# Patient Record
Sex: Male | Born: 1950 | Race: White | Hispanic: No | Marital: Married | Smoking: Former smoker
Health system: Southern US, Community
[De-identification: ages and names within clinical notes are randomized; demographics above are authoritative.]

## PROBLEM LIST (undated history)

## (undated) DIAGNOSIS — I209 Angina pectoris, unspecified: Secondary | ICD-10-CM

## (undated) DIAGNOSIS — F1911 Other psychoactive substance abuse, in remission: Secondary | ICD-10-CM

## (undated) DIAGNOSIS — N189 Chronic kidney disease, unspecified: Secondary | ICD-10-CM

## (undated) DIAGNOSIS — R519 Headache, unspecified: Secondary | ICD-10-CM

## (undated) DIAGNOSIS — Z87898 Personal history of other specified conditions: Secondary | ICD-10-CM

## (undated) DIAGNOSIS — R51 Headache: Secondary | ICD-10-CM

## (undated) DIAGNOSIS — I219 Acute myocardial infarction, unspecified: Secondary | ICD-10-CM

## (undated) DIAGNOSIS — M109 Gout, unspecified: Secondary | ICD-10-CM

## (undated) DIAGNOSIS — Z944 Liver transplant status: Secondary | ICD-10-CM

## (undated) DIAGNOSIS — F4310 Post-traumatic stress disorder, unspecified: Secondary | ICD-10-CM

## (undated) DIAGNOSIS — B1920 Unspecified viral hepatitis C without hepatic coma: Secondary | ICD-10-CM

## (undated) DIAGNOSIS — I2510 Atherosclerotic heart disease of native coronary artery without angina pectoris: Secondary | ICD-10-CM

## (undated) DIAGNOSIS — Z8639 Personal history of other endocrine, nutritional and metabolic disease: Secondary | ICD-10-CM

## (undated) DIAGNOSIS — D649 Anemia, unspecified: Secondary | ICD-10-CM

## (undated) HISTORY — DX: Personal history of other specified conditions: Z87.898

## (undated) HISTORY — DX: Atherosclerotic heart disease of native coronary artery without angina pectoris: I25.10

## (undated) HISTORY — DX: Other psychoactive substance abuse, in remission: F19.11

## (undated) HISTORY — DX: Personal history of other endocrine, nutritional and metabolic disease: Z86.39

## (undated) HISTORY — DX: Headache, unspecified: R51.9

## (undated) HISTORY — DX: Headache: R51

## (undated) HISTORY — DX: Unspecified viral hepatitis C without hepatic coma: B19.20

## (undated) HISTORY — DX: Gout, unspecified: M10.9

## (undated) HISTORY — PX: OTHER SURGICAL HISTORY: SHX169

## (undated) HISTORY — DX: Chronic kidney disease, unspecified: N18.9

## (undated) HISTORY — DX: Anemia, unspecified: D64.9

---

## 1987-09-10 HISTORY — PX: JOINT REPLACEMENT: SHX530

## 1999-09-27 ENCOUNTER — Encounter: Admission: RE | Admit: 1999-09-27 | Discharge: 1999-09-27 | Payer: Self-pay | Admitting: Orthopaedic Surgery

## 1999-09-27 ENCOUNTER — Encounter: Payer: Self-pay | Admitting: Orthopaedic Surgery

## 1999-10-30 ENCOUNTER — Encounter: Payer: Self-pay | Admitting: Orthopaedic Surgery

## 1999-11-01 ENCOUNTER — Encounter: Payer: Self-pay | Admitting: Orthopaedic Surgery

## 1999-11-01 ENCOUNTER — Inpatient Hospital Stay (HOSPITAL_COMMUNITY): Admission: RE | Admit: 1999-11-01 | Discharge: 1999-11-05 | Payer: Self-pay | Admitting: Orthopaedic Surgery

## 1999-11-03 ENCOUNTER — Encounter: Payer: Self-pay | Admitting: Orthopaedic Surgery

## 2002-09-06 ENCOUNTER — Encounter: Payer: Self-pay | Admitting: Family Medicine

## 2002-09-06 ENCOUNTER — Ambulatory Visit (HOSPITAL_COMMUNITY): Admission: RE | Admit: 2002-09-06 | Discharge: 2002-09-06 | Payer: Self-pay | Admitting: Family Medicine

## 2005-12-29 ENCOUNTER — Ambulatory Visit: Payer: Self-pay | Admitting: Emergency Medicine

## 2005-12-29 ENCOUNTER — Inpatient Hospital Stay (HOSPITAL_COMMUNITY): Admission: EM | Admit: 2005-12-29 | Discharge: 2006-01-04 | Payer: Self-pay | Admitting: Emergency Medicine

## 2007-08-18 ENCOUNTER — Ambulatory Visit: Payer: Self-pay | Admitting: Internal Medicine

## 2007-09-10 DIAGNOSIS — I219 Acute myocardial infarction, unspecified: Secondary | ICD-10-CM

## 2007-09-10 HISTORY — DX: Acute myocardial infarction, unspecified: I21.9

## 2007-11-30 ENCOUNTER — Ambulatory Visit: Payer: Self-pay | Admitting: Cardiology

## 2007-11-30 ENCOUNTER — Encounter: Payer: Self-pay | Admitting: Cardiology

## 2007-11-30 ENCOUNTER — Inpatient Hospital Stay (HOSPITAL_COMMUNITY): Admission: EM | Admit: 2007-11-30 | Discharge: 2007-12-03 | Payer: Self-pay | Admitting: Cardiology

## 2007-12-21 ENCOUNTER — Ambulatory Visit: Payer: Self-pay | Admitting: Cardiology

## 2007-12-25 ENCOUNTER — Ambulatory Visit: Payer: Self-pay | Admitting: Cardiology

## 2008-01-20 ENCOUNTER — Ambulatory Visit: Payer: Self-pay | Admitting: Cardiology

## 2008-02-16 ENCOUNTER — Ambulatory Visit: Payer: Self-pay | Admitting: Cardiology

## 2008-06-17 ENCOUNTER — Ambulatory Visit: Payer: Self-pay | Admitting: Cardiology

## 2011-01-22 NOTE — Consult Note (Signed)
NAME:  Valenti, Emmet                 ACCOUNT NO.:  [id number]   MEDICAL RECORD NO.:  [id number]           PATIENT TYPE:  AMB   LOCATION:  DAY                           FACILITY:  [facility]   PHYSICIAN:  R. Roetta Sessions, M.D. DATE OF BIRTH:  [birth year]   DATE OF CONSULTATION:  DATE OF DISCHARGE:                                 CONSULTATION   REQUESTING PHYSICIAN:  Dr. Janene Madeira, [place].   REASON FOR CONSULTATION:  Rectal bleeding   HISTORY OF PRESENT ILLNESS:  Mr. Vandeusen is a 60 year old Caucasian male.  He has had a two-week history of passing moderate amounts of bright red  blood with clots in his stool.  He tells me at times he does not even  have to have a bowel movement.  He is just passing clots.  He has been  having a bowel movement about every day or every other day.  He denies  any significant straining, denies any proctalgia or pruritus.  He has  had intermittent bilateral lower quadrant abdominal cramping and a  stabbing-type pain that is usually after a few minutes.  It is not every  day.  He denies any fever or chills, denies any history of diarrhea or  constipation.  He denies any nausea or vomiting.  His weight is down  about 10 pounds in the last 3 months.  He has noted some anorexia.  He  has rare indigestion, which he does not take medicine for, denies any  dysphagia or odynophagia.  His last colonoscopy was about 16 years ago  at [facility], per his report, prior to his liver transplant.   PAST MEDICAL/SURGICAL HISTORY:  He has history of  C. diff, which was  resolved with Flagyl back in 2004, when he was seen by Dr. Jena Gauss.  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  He has history of stage III renal failure followed by [facility].  He  had a rectal cyst removed by Dr. Larina Bras in 1972 which was benign.  He had  a left hip replacement and revision.  He has a plate in his right femur.  He has history of gout.  His last colonoscopy was  reportedly done prior  to his OLT about 16 years ago, and he believes it was benign.  He has a  history of depression.  He is status post cholecystectomy and left knee  surgery.   CURRENT MEDICATIONS:  1. Cyclosporin 100 mg daily.  2. Prozac 20 mg daily.  3. Amlodipine 10 mg daily.  4. Benazepril 40 mg daily.  5. Allopurinol 100 mg b.i.d.  6. Sodium polystyrene sulfonate, 8 ounces weekly.  7. Calcitriol 0.5 mg every other day.   ALLERGIES:  CODEINE CAUSES HIVES.   FAMILY HISTORY:  There is no known family history of colorectal  carcinoma, liver or chronic GI problems.  Mother deceased age 50,  secondary to a brain tumor.  Father decreased at age 62 secondary to  coronary artery disease and MI.  He has a lost multiple  siblings with  history significant for lung cancer, COPD and asthma.   SOCIAL HISTORY:  Mr. Lisenbee is married.  He has two healthy children.  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  He denies any  tobacco, alcohol or drug use.   REVIEW OF SYSTEMS:  See HPI, otherwise negative.   PHYSICAL EXAMINATION:  VITAL SIGNS:  Weight 161 pounds, height 71  inches, temp 98 degrees, blood pressure 162/90, pulse 60.  GENERAL:  He is a well-developed, well-nourished Caucasian male in no  acute distress.  HEENT:  Sclerae clear, nonicteric, conjunctivae pink, oropharynx pink  and moist without any lesions.  NECK:  Supple without any mass or thyromegaly.  CHEST:  Heart regular rate and rhythm, normal S1, S2 without any  murmurs, clicks, rubs or gallops.  LUNGS:  Clear to auscultation bilaterally.  ABDOMEN:  Positive bowel sounds x4.  No bruits auscultated.  Soft,  nontender, nondistended without palpable mass or hepatosplenomegaly, no  rebound tenderness or guarding.  EXTREMITIES:  Without clubbing or edema bilaterally.   IMPRESSION:  Mr. Beckom is a 59 year old Caucasian male with a two-week  history of intermittent rectal bleeding with clots.  I suspect we  are  dealing with diverticular bleeding, given the fact he is having some  intermittent abdominal cramping as well.  Other possibilities include  benign anorectal bleeding, as well as colorectal carcinoma.  He is going  to require further evaluation with colonoscopy.  Upon further  discussion, he is wanting his procedures to be done in [place], [place].  Unfortunately, our physicians do not go to [place] to perform  procedures at this time.   PLAN:  1. I have urged him to have a colonoscopy performed, and he will      contact the gastroenterologist, Dr. Dionicia Abler in [place], [place].  2. Will request recent lab work, especially CBC to be reviewed through      our office.  3. He is to contact us, if he has any further questions or is in need      of followup.      Lorenza Burton, N.P.      Jonathon Bellows, M.D.  Electronically Signed    KJ/MEDQ  D:  08/18/2007  T:  08/18/2007  Job:  [id number]   cc:   Kirstie Peri, MD  Fax: [phone]

## 2011-01-22 NOTE — Assessment & Plan Note (Signed)
[facility]                          [facility] NOTE   NAME:[name], [name]                        MRN:          [id number]  DATE:12/21/2007                            DOB:          10/31/1950    HISTORY OF PRESENT ILLNESS:  The patient is a 60 year old male with  history of recent anterior wall myocardial infarction on November 30, 2006.  The patient also has a history of liver transplant with hepatitis-C  positive and cirrhosis. The patient is currently immunosuppressive  therapy, has chronic renal failure, CKD III with creatinine of 2.5, as  well as hypertension. The patient was successfully treated with a  Liberte bare metal stent by Dr. Juanda Chance. __________  he had single vessel  coronary artery disease. Prior to his myocardial infarction, the patient  also reported hematochezia and was in the progress of getting scheduled  for colonoscopy. This will now be rescheduled, and the patient  __________  stop Plavix 1 week before the procedure. Of note, the  patient did have an episode of substernal chest pain about a week ago  which happened suddenly; described as severe substernal, lasting very  briefly but he felt presyncopal with this. He took a nitroglycerin with  resolution of his symptoms. Since that episode, he has had no further  substernal chest pain. His exercise tolerance has been good.   MEDICATIONS:  1. Aspirin 325 mg p.o. q. day.  2. Plavix 75 mg p.o. q. day.  3. Pravachol 40 mg p.o. q. day.  4. __________  cyclosporin 100 mg p.o. q. day.  5. Prozac 20 mg p.o. q. day.  6. Allopurinol 100 mg p.o. q. day.  7. Coreg 25 mg p.o. b.i.d.  8. Iron.   PHYSICAL EXAMINATION:  VITAL SIGNS:  Blood pressure 160/86, heart rate  51. Weight 163 pounds.  NECK:  Normal carotid upstroke and no carotid bruits.  LUNGS:  Clear breath sounds bilaterally.  HEART:  Regular rate and rhythm. Normal S1, S2. No murmurs or rubs.  ABDOMEN:  Soft,  nontender. No rebound or guarding. Good bowel sounds.  EXTREMITIES:  No clubbing, cyanosis, or edema.  NEURO:  The patient is alert, oriented, and grossly nonfocal.   PROBLEM LIST:  1. Status post anterior wall myocardial infarction with Liberte stent      to the LAD.  2. Essentially single vessel coronary artery disease.  3. Status post liver transplant.  4. Hepatitis-C.  5. Hematochezia.   PLAN:  1. The patient will need to continue Plavix until December 31, 2007. At      that point he can stop Plavix and can be scheduled for a      colonoscopy 1 week later.  2. The patient's episode of substernal chest pain was somewhat      concerning for angina, but he has had no exertional symptoms. We      will schedule him for a Cardiolite study, particularly in light of      his significantly abnormal electrocardiogram with giant T-wave      inversion in the anterolateral leads.  3.  I have given the patient a prescription of Imdur 30 mg p.o. q.      daily.  4. The patient will follow up for review of his Cardiolite, likely to      proceed next month with a colonoscopy.     Learta Codding, MD,FACC  Electronically Signed    GED/MedQ  DD: 12/21/2007  DT: 12/21/2007  Job #: [id number]   cc:   Lionel December, M.D.  Kirstie Peri, MD

## 2011-01-22 NOTE — Assessment & Plan Note (Signed)
[facility]                          [facility] NOTE   NAME:Minier, KASTEN LEVEQUE                        MRN:          [id number]  DATE:06/17/2008                            DOB:          June 08, 1951    PRIMARY CARDIOLOGIST:  Learta Codding, MD, [facility]   REASON FOR VISIT:  Scheduled followup.   Mr. Bailey returns to our clinic since last seen here in early June.  He  presents with no signs/symptoms suggestive of interim development of  either unstable angina pectoris or decompensated heart failure.  In  fact, he is participating in a program here at [facility] where  he can exercise regularly, 3-5 times a week.   Mr. Honse was currently granted permanent disability, given his multiple  comorbidities including ischemic cardiomyopathy.  He is also in the  process of transferring all of this care to the [facility] in [place],  [place].  He reports having had recent blood work, just 1 week  ago.   Although Mr. Hyser has gained approximately 3 pounds, he denies any PND,  orthopnea, or significant lower extremity edema.  He denies any  exertional angina pectoris, significant dyspnea, or tachy palpitations.   The patient denies any recent falls, but does have occasional sensation  of lightheadedness upon abrupt standing.  He also reports experiencing a  significant headache after taking his scheduled Imdur dose.   CURRENT MEDICATIONS:  1. Aspirin 325 daily.  2. Pravachol 40 daily.  3. Cyclosporine 100 daily.  4. Prozac 20 daily.  5. Allopurinol 100 daily.  6. Iron supplement.  7. Imdur 30 daily.  8. Norvasc 5 daily.  9. Toprol-XL 12.5 daily.   PHYSICAL EXAMINATION:  VITAL SIGNS:  Blood pressure 136/83, pulse 55,  weight 166.6 (up 3).  GENERAL:  A 60 year old male, sitting upright, in no distress.  HEENT:  Normocephalic, atraumatic.  NECK:  Palpable carotid pulses without bruits; no JVD.  LUNGS:  Clear to auscultation in all  fields.  HEART:  Regular rate and rhythm.  No significant murmurs.  No S3.  ABDOMEN:  Soft, nontender.  EXTREMITIES:  No pedal edema.  NEUROLOGICAL:  Flat affect, but no focal deficit.   IMPRESSION:  1. Ischemic cardiomyopathy.      a.     Status post acute anterior myocardial infarction/bare-metal       stenting of proximal left anterior descending, March 2009.      b.     Residual nonobstructive coronary artery disease.      c.     Ejection fraction 40% by 2D echo.      d.     Nonischemic adenosine Cardiolite; ejection fraction 52%,       April 2009.  2. Chronic renal insufficiency.  3. Asymptomatic sinus bradycardia.  4. Status post liver transplantation in 1993.      a.     Secondary to hepatitis C.  5. Chronic normocytic anemia.  6. Dyslipidemia.  7. Headaches, secondary to Imdur.   PLAN:  1. A 2D echocardiogram for reassessment of left ventricular function,  to see if there has been any recovery since his myocardial      infarction.  2. Request most recent blood work from the [facility] in [place], for      continued close monitoring of electrolytes and renal function.  Of      note, however, Mr. Ihde does state that he is in the process of      transferring all of his care, including cardiac, to the [facility]      in [place].  3. Discontinue Imdur, secondary to persistent headache.  Up titrate      Norvasc to 10 mg daily for added blood pressure and antianginal      benefit.  4. Down titrate aspirin to 81 mg daily, to be continued indefinitely.  5. Schedule return clinic follow up with myself and Dr. Andee Lineman in 6      months, or sooner if needed.      Gene Serpe, PA-C  Electronically Signed      Learta Codding, MD,FACC  Electronically Signed   GS/MedQ  DD: 06/17/2008  DT: 06/18/2008  Job #: [id number]   cc:   Kirstie Peri, MD

## 2011-01-22 NOTE — Discharge Summary (Signed)
NAME:  [name], [name]                 ACCOUNT NO.:  [id number]   MEDICAL RECORD NO.:  [id number]          PATIENT TYPE:  INP   LOCATION:  2006                         FACILITY:  [facility]   PHYSICIAN:  Everardo Beals. Juanda Chance, MD, FACCDATE OF BIRTH:  1951/08/08   DATE OF ADMISSION:  11/30/2007  DATE OF DISCHARGE:  12/03/2007                               DISCHARGE SUMMARY   PRIMARY FINAL DISCHARGE DIAGNOSIS:  ST elevation myocardial infarction.   SECONDARY DIAGNOSES:  1. History of liver transplant.  2. History of hepatitis C.  3. Hypertension.  4. History of remote accident with a right distal femur fracture      treated with open reduction and internal fixation (ORIF).  5. History of transient ischemic attack.  6. History of gout.  7. Allergy or intolerance to codeine.  8. History of left total hip arthroplasty, failed, with revision in      2001.  9. Diagnosis of rectal bleeding in December 2008.  10.Chronic kidney disease stage 3 with GFR of 33, BUN 28, creatinine      2.10 at discharge this admission.  11.History of Clostridium difficile in 2004.  12.Status post cholecystectomy and knee surgery.  13.Family history of coronary artery disease in his father.  14.Dyslipidemia with total cholesterol of 138, triglycerides 86, HDL      18, and LDL 103 on this admission.  15.History of anemia originally secondary to acute blood loss      intraoperatively, but now has a hemoglobin of 9.5, hematocrit of      28.3 post cath.   TIME OF DISCHARGE:  44 minutes.   HOSPITAL COURSE:  [name] is a 60 year old male with no previous  history of coronary artery disease.  He went to [facility] on the  day of admission because of sudden onset of chest pain.  His EKG was  consistent with an acute anterior wall infarction.  Dr. Andee Lineman, called  [name] for transfer to [facility] for emergent  catheterization.   The cardiac catheterization showed a 95% mid LAD which was treated with  PTCA  and bare-metal stent reducing the stenosis to zero.  Non  obstructive disease at 50% in the circumflex as well as 50% in the first  posterolateral branch, 30% in the RCAs is to be treated medically.  Because of renal insufficiency, no LV gram was done.  An echocardiogram  showed an EF of 40% with mildly increased left ventricular wall  thickness and an akinetic septum and apex.  There was trivial mitral  valvular regurgitation and the aortic valve was mildly calcified.  Mr.  Harrington CK-MB peaked to 115/13.3 with troponin I of 1.94.  CBC post  procedure showed a hemoglobin of 9.5, with a hematocrit 28.3.  He was  11.5/34.2 at [facility] prior to admission.  He is to follow up with his  primary care physician and GI physician for this.  A lipid profile was  described above and he was initially put on 40 Zocor.  However, Mr.  Harrington stated that he has trouble affording his medications and requested  a $4 drug, therefore, the Zocor was changed to Pravachol.   [name] was seen by cardiac rehab and had a nutrition consult as well.  He had some hyperkalemia with an initial potassium level of 5.9.  This  decreased by discharge to 4.5.  He was given information on renal diet  and avoiding potassium-rich foods.  [name] indicates that he will be  compliant with this.  He wants to follow with cardiac rehab as an  outpatient.  Postprocedure his BUN and creatinine remains fairly stable  with a preprocedure BUN and creatinine of 42/2.5, which dropped to  21/2.0 postprocedure, and by discharge he was 28/2.1.  GFR did not  significantly change during the stay.   On December 03, 2007, [name] was ambulating without chest pain or  shortness of breath.  He had been on possibly both Lotrel and amlodipine  prior to admission, but these were both stopped.  He had a beta blocker  added to his medication regimen for because of his renal insufficiency  and recent dye use, Dr. Juanda Chance recommended avoiding an ACE  inhibitor for  now.  Dr. Juanda Chance evaluated [name] and felt that he could be safely  discharged home on December 03, 2007, with close outpatient followup.   DISCHARGE INSTRUCTIONS:  1. His activity level is to be increased gradually.  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  He      can return to work when cleared by MD.  He is encouraged to stick      to a low-sodium, heart-healthy, low-potassium diet.  He is to      follow up with Dr. Sherryll Burger, Dr. Eustaquio Maize, Dr. Dudley Major, Dr. Marcy Salvo, and Dr.      Kristian Covey, as needed.   DISCHARGE MEDICATIONS:  1. Aspirin 325 mg daily.  2. Plavix 75 mg daily.  3. Nitroglycerin sublingual p.r.n.  4. Pravachol 40 mg a day.  5. Cyclosporine 100 mg daily.  6. Prozac 20 mg a day.  7. He is to stop Lotrel and benazepril.  8. He is to hold amlodipine 10 mg daily.  9. Allopurinol 100 mg daily.  10.Carvedilol 25 mg 1 tab b.i.d.      [name], PA-C      Bruce R. Juanda Chance, MD, [facility]  Electronically Signed    RB/MEDQ  D:  12/03/2007  T:  12/04/2007  Job:  [id number]   cc:   Vladimir Faster, MD  Hilliard Clark. Eustaquio Maize, MD  Clearnce Hasten. Marcy Salvo, MD  Dr. Harold Barban, M.D.

## 2011-01-22 NOTE — Cardiovascular Report (Signed)
[name], [name]                 ACCOUNT NO.:  [id number]   MEDICAL RECORD NO.:  [id number]          PATIENT TYPE:  OIB   LOCATION:  2903                         FACILITY:  [facility]   PHYSICIAN:  Jose R. Juanda Chance, MD, FACCDATE OF BIRTH:  [birth year]   DATE OF PROCEDURE:  DATE OF DISCHARGE:                            CARDIAC CATHETERIZATION   PROCEDURE:  Cardiac catheterization and percutaneous coronary  intervention.   HISTORY:  [name] is 60 years old with no prior history of known heart  disease.  He does have history of a liver transplant in 1993 at [facility].  He  also has renal insufficiency with creatinine of 2.4.  He also has had  some recent bright red rectal bleeding which has not yet  been  evaluated.  Today at 8 o'clock he developed chest pain and came to  [facility] emergency room where his ECG showed an acute anterior wall  infarction.  He was seen by Dr. Andee Lineman and transferred to [place] by [facility]  for intervention.   PROCEDURE:  The procedure was performed via the right femoral arterial  sheath and 6-French preformed coronary catheters.  A front wall arterial  puncture was performed and Omnipaque contrast was used.  Because of the  elevated creatinine, we attempted to minimize contrast.  We only took  one picture of the right coronary artery.  Did not take picture of the  left ventricle.  At completion of the diagnostic study, we recognized  there was a tight lesion in the proximal LAD and preparation was made  for intervention.   The patient given antiemetics bolus infusion.  He had already been given  a Plavix load and chewable aspirin.  We used a Q-4 Guidant 6-French  guiding catheter with side holes.  We crossed the lesion in the proximal  LAD with a Prowater wire without difficulty.  We predilated with 2.5 x  50-mm Maverick, performing one inflation up to 8 atmospheres for 30  seconds.  We then deployed a 3 x 20 mm Liberte stent.  This stent  overlapped the first moderate  size diagonal branch.  I would deploy this  with one inflation of 14 atmospheres for 30 seconds.  We then  postdilated with a 3.5 x 15 mm Ambler Voyager performing two inflations to  14 atmospheres for 20 seconds.  The stent __________  the diagonal  branch, but did not compromise flow and the flow remained TIMI-3.   The patient tolerated the procedure well and left the laboratory in  satisfactory condition.  The right femoral was closed was Angio-Seal  anesthesia.   The aortic pressure was 165/87 with mean of 124 and left ventricular  pressure is 165/23.   Left main coronary artery:  The  left main coronary artery is free of  significant disease.   Left anterior descending artery:  The left anterior descending artery  gave rise to a small diagonal branch, a moderate size septal perforator,  moderate sized diagonal branch and then had a 95 percent stenosis.  The  LAD then gave rise to three septal perforators.  The  flow was TIMI-3.   The circumflex artery:  The circumflex artery gave rise to two marginal  branches and two posterolateral branches.  There was 50% ostial stenosis  in the circumflex artery.  There was 50% narrowing in the first  posterolateral branch.   The right coronary:  The right coronary artery was a large vessel who  gave rise to a conus branch, a right ventricular branch, posterior  descending branch and two posterolateral branches.  There are  irregularities in the proximal and mid vessel and 30% narrowing in the  mid vessel.   No left ventriculography was performed.   Following stenting of the lesion in the proximal and mid LAD, the  stenosis improved from 95% to 0% and the flow was TIMI-3 before and  after intervention.   CONCLUSION:  1. Acute anterior wall myocardial infarction with 95% stenosis in the      proximal to mid LAD, 50% ostial stenosis in the circumflex artery      with 30% stenosis in the first posterolateral branch, 30% narrowing      in the  mid right coronary.  2. Successful percutaneous coronary intervention of the lesion in the      proximal to mid LAD using a Liberte bare metal stent with      improvement in narrowing from 95% to 0% with right TIMI-3 flow      before and after intervention.    The patient had the onset of chest pain at 0800 and arrived at [facility]  emergency department at 0024.  He arrived at [facility] cath lab via  [facility] at 1029.  First balloon inflation with 1057.  This gave a  balloon time from [facility] of 91 minutes and a reperfusion time  of 2 hours and 57 minutes.   DISPOSITION:  The patient was taken to __________  for further  observation.  Will need to keep a close eye on his renal function.  If  he is stable, he may b e able to go home on day 2 or 3.  Will get an  echocardiogram on him today.      Jose Elvera Lennox Juanda Chance, MD, [facility]  Electronically Signed     BRB/MEDQ  D:  11/30/2007  T:  11/30/2007  Job:  [id number]   cc:   Learta Codding, MD,FACC

## 2011-01-22 NOTE — Assessment & Plan Note (Signed)
[facility]                          [facility] OFFICE NOTE   NAME:[name], [name]                        MRN:          [id number]  DATE:02/16/2008                            DOB:          1950-09-18    PRIMARY CARDIOLOGIST:  Learta Codding, MD, [facility].   REASON FOR VISIT:  Post hospital followup.   The patient returns to the clinic after recent hospitalization here at  [facility] for syncope.  He was seen in consultation by Dr. Lewayne Bunting on  April 17, just 4 days after he was last seen here in the clinic for  routine followup.  Dr. Andee Lineman noted that the patient presented with life-  threatening hyperkalemia (potassium 6.2), with associated renal  insufficiency (creatinine greater than 3.0), as well as worsening anemia  (hemoglobin 8.2).  Dr. Andee Lineman felt that the patient had significant  bradycardia in the setting of baseline bradycardia with Coreg and  hyperkalemia.  He was treated with Kayexalate and also referred to Dr.  Kristian Covey for assistance.  Coreg was discontinued and arrangements were  made for the patient to be placed on a CardioNet monitor, following  discharge, to rule out dysrhythmia or persistent bradycardia.  Dr.  Andee Lineman also made it clear that, although the patient presented with  anemia and required further evaluation, he should remain on Plavix until  April 23, representing one full month since treatment with a bare metal  stent.   The patient did undergo a colonoscopy, by Dr. Karilyn Cota, which was  essentially negative, save for some external/internal hemorrhoids.  It  was felt these could be the reason for the rectal bleeding, while he was  on aspirin and Plavix.   CardioNet monitoring was completed and indicated normal sinus rhythm  with sinus bradycardia in the 30-40 beats per minute range, with some  associated symptoms.  There were no significant pauses, however, or  evidence of ventricular dysrhythmia.   The patient reports  no further syncope.  He has, however, had occasional  dizziness.  He has also since been started on Toprol 25 daily, by Dr.  Sherryll Burger, for treatment of hypertension.  He states that this was started  after he completed CardioNet monitoring.   Electrocardiogram in our office today reveals marked sinus bradycardia  at 43 beats per minute with normal axis and no ischemic changes.   CURRENT MEDICATIONS:  1. Plavix.  2. Full-dose aspirin.  3. Pravachol 40 daily.  4. Cyclosporin 100 daily.  5. Prozac 20 daily.  6. Allopurinol 100 daily.  7. Supplemental iron.  8. Toprol XL 25 daily.  9. Imdur 30 daily.  10.Norvasc 5 daily.   PHYSICAL EXAMINATION:  VITAL SIGNS:  Blood pressure 138/80, pulse 53.  Regular weight 163.6.  GENERAL:  A 60 year old male, sitting upright, no distress.  HEENT:  Normocephalic, atraumatic.  NECK:  Palpable bilateral carotid pulses without bruits; no JVD.  LUNGS:  Clear to auscultation all fields.  HEART:  Regular rhythm (S1, S2).  No significant murmurs.  ABDOMEN:  Soft, nontender.  EXTREMITIES:  No significant edema.  NEUROLOGIC:  No focal  deficit.   IMPRESSION:  1. Ischemic cardiomyopathy.      a.     Status post acute anterior myocardial infarction treated       with bare metal stenting of high-grade proximal left anterior       descending, March 2009.      b.     Residual nonobstructive coronary artery disease.      c.     Ejection fraction 40% by 2-D echocardiogram.  .      d.     Nonischemic adenosine Cardiolite; ejection fraction 52%,       April 2009.  2. Chronic renal insufficiency.  3. Sinus bradycardia.  4. Status post liver transplant, on cyclosporin.  5. Hepatitis C.  6. Anemia, most likely secondary to hemorrhoids.  7. Dyslipidemia.  8. Status post syncope in context of hyperkalemia, worsening renal      failure, and anemia.   PLAN:  1. Down titrate Toprol from 25 to 12.5 mg daily, in light of the      marked sinus bradycardia.  Review of  the cardiac monitor strips      does indicate a possible transient episode of second-degree AV      block (2:1), but during which the patient was asymptomatic.  Again,      no ventricular dysrhythmia was noted.  2. Follow up blood work with a complete metabolic profile for      monitoring of potassium, renal function, as well as liver enzymes.  3. Add fish oil 1 capsule twice daily for aggressive lipid management.      Most recent lipid profile notable for total cholesterol 153, HDL      34, triglycerides 102 and LDL 99.  4. Schedule return clinic followup with myself and Dr. Andee Lineman in 2      months, for continued close monitoring and further recommendations.      Of note, we may repeat a 2-D echocardiogram at that time to see if      there has been any recovery in LV function.  His      ejection fraction was estimated at 40%, by 2-D echocardiogram, when      he presented with his myocardial infarction.      Gene Serpe, PA-C  Electronically Signed      Learta Codding, MD,FACC  Electronically Signed   GS/MedQ  DD: 02/16/2008  DT: 02/16/2008  Job #: [id number]   cc:   Kirstie Peri, MD

## 2011-01-25 NOTE — Letter (Signed)
February 23, 2008    To Whom It  May Concern:   RE:  [name], [name]  MRN:  [id number]  /  DOB:  07-05-1951   [name] is a 60 year old male, well-known to [place], with complex past  medical history, including significant coronary artery disease.  His  cardiac history is notable for ischemic cardiomyopathy, having suffered  an acute anterior myocardial infarction in March 2009, treated with bare-  metal stenting of a high-grade stenosis of the proximal left anterior  descending artery.  His ejection fraction, by 2D echocardiography, at  that time was 40%.  He also has asymptomatic sinus bradycardia.   [name] medical history is also complicated by his having undergone  prior liver transplantation, and he is on cyclosporine.  He also has  chronic anemia, history of hepatitis C, and chronic renal insufficiency.   [name] was most recently hospitalized here at [facility] with  syncope, and was found to have marked hyperkalemia in the setting of  acute/chronic renal insufficiency and worsening anemia, requiring  transfusion.   From a cardiac perspective, [name] is cleared to return to work.  However, we recommend that he lift no more than 20 pounds, given his  underlying cardiac condition.    Sincerely,       Gene Serpe, PA-C       Learta Codding, MD,FACC    GS/MedQ  DD: 02/23/2008  DT: 02/23/2008  Job #: [id number]

## 2011-01-25 NOTE — Letter (Signed)
February 18, 2008    To Whom It May Concern:   RE:  REMI, LOPATA  MRN:  [id number]  /  DOB:  1951-01-14   Mr. Zingale is a 60 year old male, well-known to [place], with complex past  medical history, including significant coronary artery disease.  His  cardiac history is notable for ischemic cardiomyopathy, having suffered  an acute anterior myocardial infarction in March 2009, treated with bare-  metal stenting of a high-grade stenosis of the proximal left anterior  descending artery.  His ejection fraction, by 2D echocardiography, at  that time was 40%.  He also has asymptomatic sinus bradycardia.   Mr. Bantz medical history is also complicated by his having undergone  prior liver transplantation, and he is on chronic cyclosporine.  He also  has chronic anemia, history of hepatitis C, and chronic renal  insufficiency.   Mr. Matuszak was most recently hospitalized here at [facility] in  [place], [place], with syncope, and was found to have marked hyperkalemia in the  setting of acute/chronic renal insufficiency and worsening anemia,  requiring transfusion.    Sincerely,     Gene Serpe, PA-C       Learta Codding, MD,FACC    GS/MedQ  DD: 02/18/2008  DT: 02/19/2008  Job #: [id number]

## 2011-01-25 NOTE — Op Note (Signed)
[place]  Patient:    [name], [name]                        MRN: [id number] Proc. Date: 11/01/99 Adm. 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  OPERATION PERFORMED: 1. Revision of polyethylene component, left total hip replacement. 2. Radical synovectomy of left hip joint. 3. Revision of femoral head.  SURGEON:  Claude Manges. Cleophas Dunker, M.D.  ASSISTANT:  Arnoldo Morale, P.A.  ANESTHESIA:  General orotracheal.  COMPLICATIONS:  None.  COMPONENTS:  [employer] SROM polydial liner, not constrained, 28 mm L series with a 20 degree buildup.  A large tapered cobalt chrome 28 mm femoral head with a +8 neck length and two peripheral 5 mm screws, one 40 mm in length, the other one 35 mm in length to maintain the position of the acetabulum .  DESCRIPTION OF PROCEDURE:  With the patient comfortable on the operating table nd under general orotracheal anesthesia, the patient was placed in the lateral decubitus position with the left side up.  The patient was then secured to the operating table with the Innomed hip system.  The left hip was then prepped with Betadine scrub and then DuraPrep from the iliac crest circumferentially to below the knees.  Sterile draping was performed.  The previous Southern incision was utilized and by sharp dissection carried down to subcutaneous tissues.  Gross bleeders were Bovie coagulated.  Self-retaining retractors were inserted.  The iliotibial band was identified and incised along the length of the skin incision.  The retractors were placed further deep into the wound.  The short external  rotators were identified, tagged and then tendinous structures were tagged and incised.  The capsule was identified and incised along the femoral neck and head.  There was probably 3 to 4 cc of clear yellow joint effusion.  There was diffuse synovitis and evidence of polyethylene synovitis.  A radical synovectomy was performed circumferentially.  At that point I could see the periphery of the acetabulum.  The two peripheral screws were removed.  The hip as then dislocated posteriorly.  The 32 mm ceramic head was removed and I could easily rotate the polyethylene liner and remove it without difficulty.  Further synovectomy was then performed immediately as I could not visualize the medial ip joint and capsule.  I thought I had an excellent debridement of the joint. There were no obvious loose pieces of polyethylene as the polyethylene component had ot fractured, but I am sure that the hypertrophic synovium represented polyethylene reaction.  We trialed a new polyethylene liner with a 28 mm ball and felt that it was quite stable.  Accordingly, the trial component was removed.  We inserted the 28 mm polyethylene component and fixed it with two peripheral screws maintaining the 0 degree buildup posteriorly as it was preoperatively.  The ceramic heads were not available and we felt that after 12 years there was probably enough wear of the  ceramic head that we ought to replace it.  Accordingly, we trialed the cobalt chrome femoral  head.  We trialed several neck lengths including +3, +5 and +8 and felt that the +8 would give [place] about symmetrical leg lengths and it was quite stable.  Accordingly, the final +8 hip ball was applied, impacted and then reduced. The leg was then placed through a range of motion.  There was no dislocation at  flexion and adduction and probably 15 degrees of internal rotation.  Throughout the case, the wound was irrigated with saline solution  and antibiotic solution.  The capsule was closed anatomically with 0 Ethibond short external rotators were closed with similar material.  The iliotibial band was closed with running 0 Vicryl.  The subcutaneous closed in two layers with running 0 and 2-0  Vicryl.  Skin closed with skin clips.   Sterile bulky dressing was applied. Patient placed in supine position.  Leg lengths appeared to be symmetrical. Knee immobilizer applied and the patient returned to the post anesthesia recovery room in satisfactory condition. DD:  11/01/99 TD:  11/01/99 Job: [id number]
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
## 2011-01-25 NOTE — Consult Note (Signed)
NAMETYLAND, KLEMENS NO.:  [id number]   MEDICAL RECORD NO.:  [id number]          PATIENT TYPE:  INP   LOCATION:  3112                         FACILITY:  [facility]   PHYSICIAN:  Michelene Gardener, MD    DATE OF BIRTH:  1950-10-29   DATE OF CONSULTATION:  DATE OF DISCHARGE:                                   CONSULTATION   REFERRING PHYSICIAN:  Dr. Lubertha Basque. Dalldorf.   REASON FOR CONSULTATION:  Altered mental status and decreased oxygen  saturation.   HISTORY OF PRESENT ILLNESS:  This is a 60 year old Caucasian male with past  medical history of hypertension, who presented to the hospital on December 29, 2005 with multiple injuries.  He was diagnosed with a femur fracture and was  seen by an orthopedic surgeon.  He underwent femur fracture repair today.  He was doing fine and he was sent to the orthopedic floor.  He was started  on pain medications and was receiving a Dilaudid bump.  While on the floor,  he became less responsive and very lethargic and when his saturation was  checked, his oxygen saturation went down to the 30s.  The nurse called the  rapid response team, who came and evaluated him.  He was started on a BiPAP  machine.  His Dilaudid PCA was discontinued and he was given an ampule of  Narcan.  After that, he became more responsive and he was transferred to  ICU.  When I saw him, he was on a BiPAP machine; he was saturating at 100%.  His other vitals were stable and he was returned to his normal level of  consciousness.   PAST MEDICAL HISTORY:  1.  Hypertension.  2.  History of hepatitis C.  3.  History of liver transplant in 1989.  4.  History of TIA.  5.  Gout.   MEDICATIONS:  1.  Allopurinol 100 mg once daily.  2.  Cyclosporin 100 mg once daily.  3.  Norvasc 5 mg once daily.  4.  Prozac 20 mg once daily.  5.  Bumex 0.5 mg once daily.  6.  Metoprolol -- unknown dose.   ALLERGIES:  The patient is allergic to CODEINE, where he gets hives from  that.   SOCIAL HISTORY:  The patient smokes cigars.  He lives alone.  He takes  alcohol occasionally and he denies recreational drugs.   FAMILY HISTORY:  Noncontributory.   REVIEW OF SYSTEMS:  Review of systems is just positive for mild pain and  currently he is not complaining of anything.  As mentioned above, he has  some altered mental status and he had oxygen desaturation.  CONSTITUTIONAL:  There is no fever and no fatigability, no weight change.  EYES:  No blurred  vision, no pain nor redness.  ENT:  No tinnitus.  No hear pain.  No hearing  loss.  No epistaxis.  RESPIRATORY:  No cough.  No wheeze.  No hemoptysis.  It is positive for some dyspnea.  CARDIOVASCULAR:  No chest pain, no edema,  no palpitations, no syncope.  GI:  No nausea, no vomiting, no diarrhea, no  abdominal pain, no hematemesis, no melena, no constipation.  GU:  No  dysuria, no hematuria and no frequency.  ENDOCRINE:  No polyuria, no  nocturia, no sweating.  HEMATOLOGY:  No bruising.  No bleeding.  ID:  No  rash.  No lesions.  NEUROLOGY:  No numbness, no weakness, no dysarthria, no  tremors.  The rest of the systems were reviewed and they were negative.   PHYSICAL EXAMINATION:  VITAL SIGNS:  The last one that was done around 1  a.m. showed temperature of 99, pulse 90, respiratory rate is 20, blood  pressure is 149/78 and oxygen saturation is 100% on BiPAP machine, where he  is getting 2 L.  GENERAL APPEARANCE:  This is a middle-aged Caucasian male who is lying down  in bed, not in acute distress at the present time and he is connected to a  BiPAP machine.  HEENT:  Conjunctivae are normal.  There is no blood or erythema.  Pupils are  equal, round and reactive to light and accommodation.  There is no ptosis.  Hearing is intact.  There is no ear discharge or infection.  There is no  nose discharge, infection or bleeding.  Oral mucosa is moist.  There is no  pharyngeal erythema.  NECK:  Supple.  No JVD, no carotid  bruit, no lymphadenopathy, no thyroid  enlargement, no thyroid tenderness.  CARDIOVASCULAR:  S1 and S2 are heard.  There are no additional heart sounds.  There are no murmurs, no gallops  and no thrills.  RESPIRATORY:  The patient is breathing between 20-22.  He is not using  accessory muscles.  His chest is clear to auscultation and there are no  rales, no rhonchi and no wheezes and breath sounds are normal.  ABDOMEN:  The abdomen is soft and not distended.  __________.  There is no  hepatosplenomegaly.  Bowel sounds are normal.  Umbilicus is central.  LOWER EXTREMITIES:  There are some lacerations in his lower extremities with  his right femur status post surgery.   LABORATORY RESULTS:  ABG on BiPAP showed pH of 7.363, PCO2 39.2, bicarb of  22.3.  WBC 12.0, hemoglobin 11.4, hematocrit 33.8, MCV 94.2.  INR is 1.0.  Sodium 138, potassium 5.9, chloride 111, bicarb 26, glucose 299, BUN 39,  creatinine 2.7.   ASSESSMENT AND PLAN:  1.  Altered mental status.  This is most likely secondary to narcotic      overdose.  This patient received Narcan by rapid response team and now      he is back to his regular mental state.  We will follow his mental      status and if he began again with altered mental status, then we will      consider CT scan of his head.  2.  Hypoxemia.  This is most likely secondary to the effect of narcotics on      his respiratory center.  The Dilaudid patient-controlled analgesic was      discontinued.  At the present time, his oxygen saturation is 100% on bi-      level positive airway pressure.  We will continue his bi-level positive      airway pressure.  His arterial blood gas on bi-level positive airway      pressure looks good.  In the morning we will try to taper out his bi-      level positive airway pressure machine  and we will check his oxygen      again.  If his oxygen is still low, then we will consider CT scan of the     chest to rule out pulmonary embolus.   We will continue his      anticoagulation for pulmonary embolus prophylaxis.  3.  Leukocytosis.  This is most likely secondary to stress and no evidence      of infection.  We will repeat his CBC.  4.  Normocytic anemia.  We will follow his CBC.  5.  Hyperkalemia.  His potassium is 5.9; we will give him Kayexalate and we      will repeat his BMP in the morning.  6.  Renal insufficiency.  His BUN/creatinine are 39.2/2.7; the previous one      was 47/2.9 and there is some improvement, so we will continue his      intravenous fluids and repeat his basic metabolic panel.  7.  Hypertension.  We will continue his current medications.   Thank you so much.   TOTAL CONSULTATION TIME:  Fifty-eight minutes.           ______________________________  Michelene Gardener, MD     NAE/MEDQ  D:  12/30/2005  T:  12/31/2005  Job:  [id number]

## 2011-01-25 NOTE — Discharge Summary (Signed)
[place]  Patient:    [name], [name]                        MRN: [id number] Adm. Date:  [id number] Disc. Date: [id number] Attending:  Randolm Idol Dictator:   Jamelle Rushing, P.A.-C.                           Discharge Summary  ADMISSION DIAGNOSES: 1. Failed left total hip arthroplasty, acetabular poly component. 2. Hepatitis C status post liver transplant. 3. Hypertension.  DISCHARGE DIAGNOSES: 1. Revision of left total hip arthroplasty. 2. Hepatitis C status post liver transplant. 3. Hypertension.  HISTORY OF PRESENT ILLNESS:  This is a 60 year old [place] male with a history of  left total hip arthroplasty in 1984.  Patient has had increase in discomfort over the last several months.  The pain is described as stabbing pain located inside the thigh.  The pain is constant with no radiation.  The pain increases with bending, stepping, climbing, and decreases with rest.  Patient is not currently taking any medications, not using any assist devices, and no complaints of lower extremity  weakness or numbness.  ALLERGIES ON ADMISSION:  CODEINE causing hives.  MEDICATIONS ON ADMISSION: 1. Cyclosporin one tablet p.o. q.12h., 100 mg tablets. 2. Norvasc 5 mg p.o. q.d. 3. Allegra 60 mg p.o. q.12h. 4. Rhinocort nasal spray one spray each naris q.d. 5. Ferrous sulfate one tablet p.o. b.i.d.  SURGICAL PROCEDURE:  On February 22, patient was taken to the OR by Norlene Campbell, M.D., assisted by Arnoldo Morale, P.A.-C., and under general anesthesia, the patient underwent a revision of the polyethylene component of a left total ip replacement, a radical synovectomy of left hip joint, and a revision of the femoral head.  There were no complications.  The patient tolerated the surgical procedure well and was transferred to the recovery room in satisfactory condition.  CONSULTS:  On February 22, following routine consults, requested  physical therapy, rehab, occupational therapy, and pharmacy for routine Coumadin protocol.  HOSPITAL COURSE:  On February 22, patient was admitted to [facility] under the care of Dr. Norlene Campbell.  The patient was taken to the OR for a revision of his left total hip arthroplasty.  The patient underwent general anesthesia. The surgical procedure was completed without any complications and the patient was transferred to the recovery room and then to the orthopedic floor without any further complications.  Patient had a four-day postoperative course that involved some nausea and vomiting over the first two postoperative days.  Patients H&H remained stable and an abdominal flat-plate to rule out any obstruction or ileus showed some slight prominent gas in the sigmoid but, otherwise, was negative.  The patient was treated with n.p.o. for one day and Reglan, and his symptoms resolved.  Patients CMET remained stable and his H&Hs remained stable throughout his hospitalization. The patient remained afebrile, vital signs stable, and on postoperative day #4, patient was discharged to home with home health physical therapy and a home health R.N. for Coumadin pro time draws.  LABORATORY DATA:  EKG on admission showed sinus bradycardia at 55 beats per minute and otherwise normal EKG.  CBC on admission shows WBCs 3.9, hemoglobin 12.1, hematocrit 34.5, platelets 161. On February 25, WBCs were 6.2, hemoglobin 9.1, hematocrit 25.9, and platelets 131. Coag studies on February 23, PT was 15.4 with a 1.4 INR.  On February 24, PT was 17.8 with a 1.8 INR.  Routine chemistries on admission found potassium of 5.5, BUN 30, creatinine 2.1, total protein 8.5, alkaline phosphatase at 125.  Other values normal.  UA on admission was normal.  Patient received one unit of packed red blood cells intraoperatively.  MEDICATIONS ON DISCHARGE: 1. Vancenase 84 mcg nasal spray one puff q.d. 2.  Senokot one tablet p.o. b.i.d. before meals. 3. Cyclosporin 100 mg p.o. b.i.d. 4. Norvasc 5 mg p.o. q.d. 5. Claritin 10 mg p.o. q.d. 6. Ferrous sulfate 325 mg p.o. b.i.d. 7. Docusate 100 mg p.o. b.i.d. 8. OxyIR 5 mg one to two tablets p.o. q.4-6h. p.r.n. pain.  DISCHARGE INSTRUCTIONS: 1. Coumadin 5 mg and adjusted per pharmacy. 2. OxyIR 5 mg one to two tablets p.o. q.4-6h. p.r.n. pain.  ACTIVITY:  Weightbearing as tolerated with the use of a walker until follow-up evaluation with Dr. Cleophas Dunker.  DIET:  No restrictions.  WOUND CARE:  Keep left hip clean and dry.  FOLLOW-UP:  Patient is to call [phone] for a follow-up appointment in one week  with Dr. Cleophas Dunker.  CONDITION ON DISCHARGE:  The patients condition on discharge to home is listed s improved and stable. DD:  12/01/99 TD:  12/02/99 Job: [id number]

## 2011-06-03 LAB — BASIC METABOLIC PANEL
BUN: 21
Calcium: 8.6
Calcium: 8.6
Chloride: 115 — ABNORMAL HIGH
Chloride: 116 — ABNORMAL HIGH
Chloride: 117 — ABNORMAL HIGH
Creatinine, Ser: 2 — ABNORMAL HIGH
Creatinine, Ser: 2.1 — ABNORMAL HIGH
GFR calc Af Amer: 37 — ABNORMAL LOW
GFR calc Af Amer: 42 — ABNORMAL LOW
GFR calc non Af Amer: 31 — ABNORMAL LOW
GFR calc non Af Amer: 33 — ABNORMAL LOW
GFR calc non Af Amer: 35 — ABNORMAL LOW
Glucose, Bld: 112 — ABNORMAL HIGH
Potassium: 4.5
Sodium: 136
Sodium: 140

## 2011-06-03 LAB — CARDIAC PANEL(CRET KIN+CKTOT+MB+TROPI)
CK, MB: 13.3 — ABNORMAL HIGH
CK, MB: 7.8 — ABNORMAL HIGH
Relative Index: 11.6 — ABNORMAL HIGH
Relative Index: 7.6 — ABNORMAL HIGH
Total CK: 115
Troponin I: 1.94

## 2011-06-03 LAB — CBC
MCV: 100
Platelets: 171
RBC: 2.83 — ABNORMAL LOW
RDW: 15.1

## 2011-06-03 LAB — LIPID PANEL
Cholesterol: 138
HDL: 18 — ABNORMAL LOW
Total CHOL/HDL Ratio: 7.7
Triglycerides: 86
VLDL: 17

## 2011-10-16 ENCOUNTER — Ambulatory Visit (HOSPITAL_COMMUNITY)
Admission: RE | Admit: 2011-10-16 | Discharge: 2011-10-16 | Disposition: A | Discharge status: Home / Self Care | Payer: Medicare HMO | Source: Ambulatory Visit | Attending: Internal Medicine | Admitting: Internal Medicine

## 2011-10-16 ENCOUNTER — Other Ambulatory Visit (HOSPITAL_COMMUNITY): Payer: Self-pay | Admitting: Internal Medicine

## 2011-10-16 DIAGNOSIS — M109 Gout, unspecified: Secondary | ICD-10-CM

## 2011-10-16 DIAGNOSIS — M25579 Pain in unspecified ankle and joints of unspecified foot: Secondary | ICD-10-CM

## 2011-10-16 DIAGNOSIS — M19079 Primary osteoarthritis, unspecified ankle and foot: Secondary | ICD-10-CM | POA: Insufficient documentation

## 2012-04-20 DIAGNOSIS — R55 Syncope and collapse: Secondary | ICD-10-CM

## 2012-04-21 DIAGNOSIS — I2510 Atherosclerotic heart disease of native coronary artery without angina pectoris: Secondary | ICD-10-CM

## 2012-04-21 DIAGNOSIS — R55 Syncope and collapse: Secondary | ICD-10-CM

## 2012-04-22 DIAGNOSIS — R072 Precordial pain: Secondary | ICD-10-CM

## 2012-04-23 ENCOUNTER — Encounter: Payer: Medicare HMO | Admitting: Cardiology

## 2012-04-24 ENCOUNTER — Encounter: Payer: Self-pay | Admitting: Cardiology

## 2012-04-27 ENCOUNTER — Encounter: Payer: Self-pay | Admitting: Physician Assistant

## 2012-04-27 DIAGNOSIS — R079 Chest pain, unspecified: Secondary | ICD-10-CM

## 2012-04-28 ENCOUNTER — Encounter: Payer: Self-pay | Admitting: *Deleted

## 2012-05-04 ENCOUNTER — Encounter: Payer: Medicare HMO | Admitting: Cardiology

## 2012-05-18 ENCOUNTER — Encounter: Payer: Medicare HMO | Admitting: Cardiology

## 2012-05-27 ENCOUNTER — Ambulatory Visit: Payer: Medicare HMO | Admitting: Physician Assistant

## 2012-08-04 IMAGING — CR DG FOOT COMPLETE 3+V*R*
3 series · 3 of 3 positions shown · non-contrast
Comparison: None.

CLINICAL DATA: Pain on top of the foot for 2 weeks.

RIGHT FOOT COMPLETE - 3+ VIEW

[view not recorded (1 of 3)]
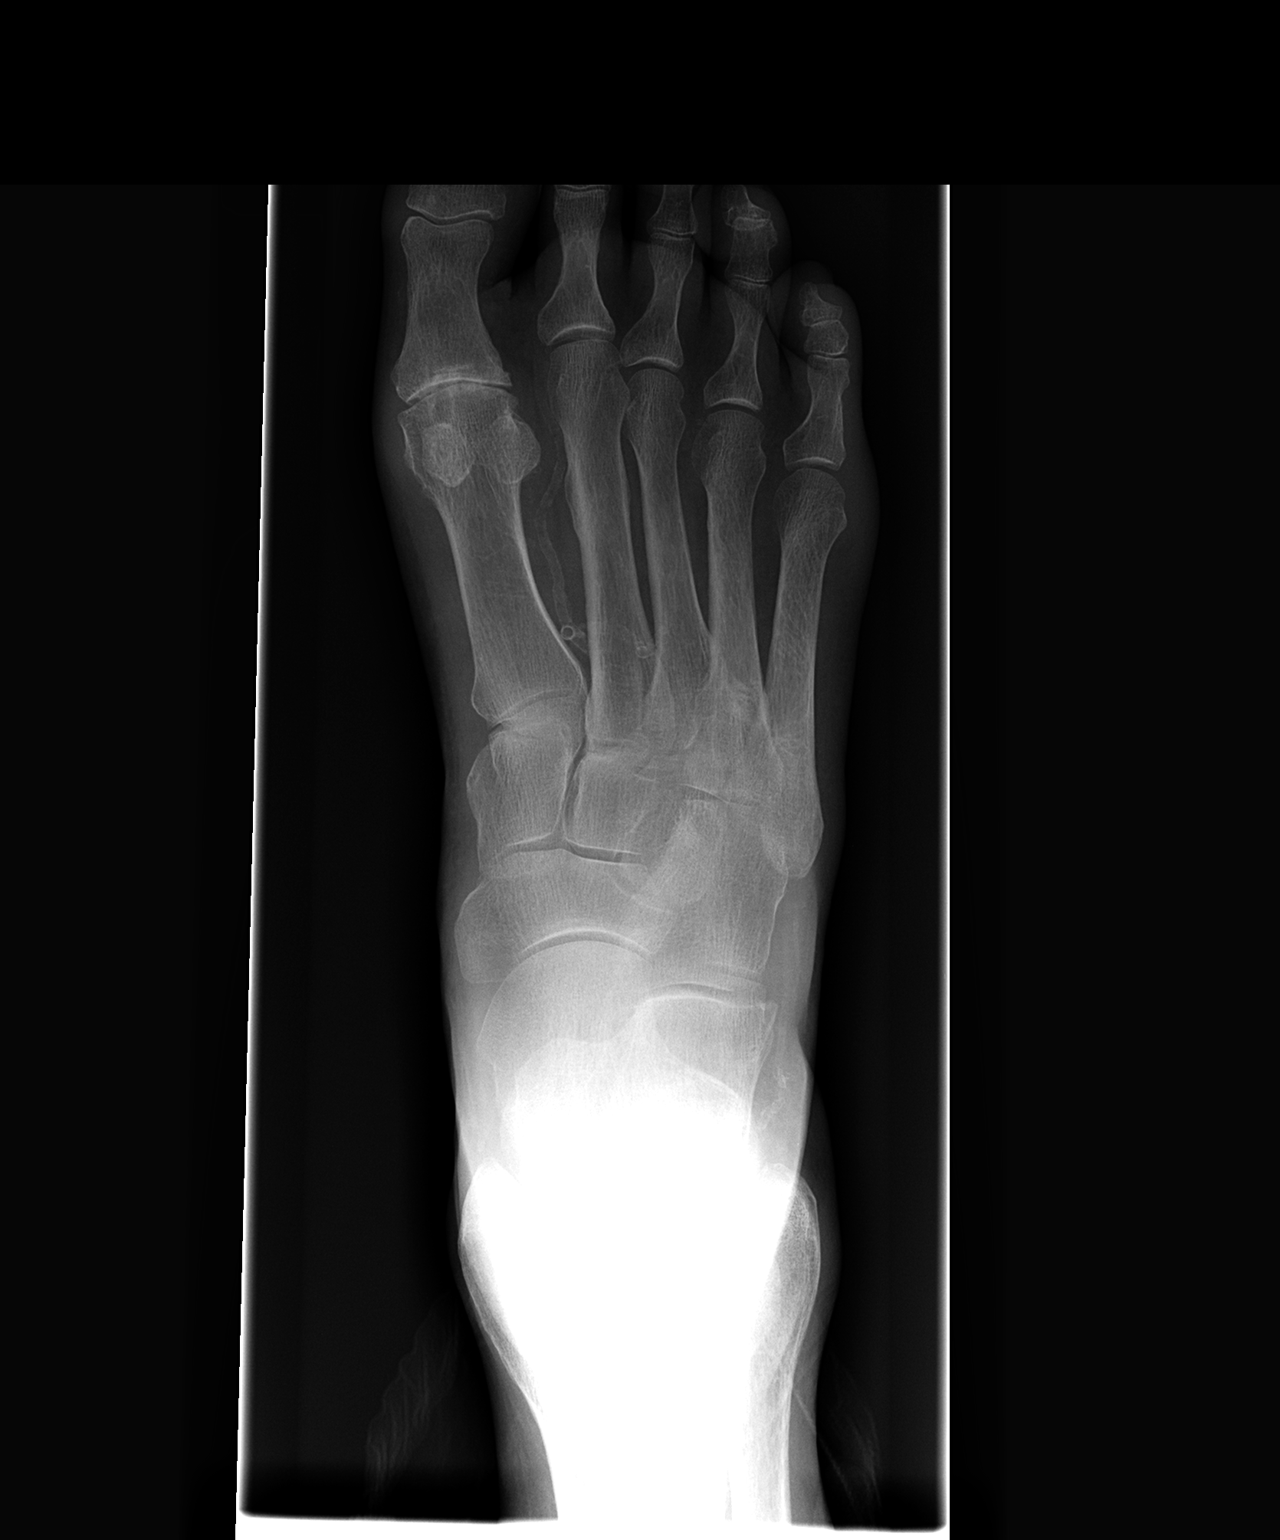

[view not recorded (2 of 3)]
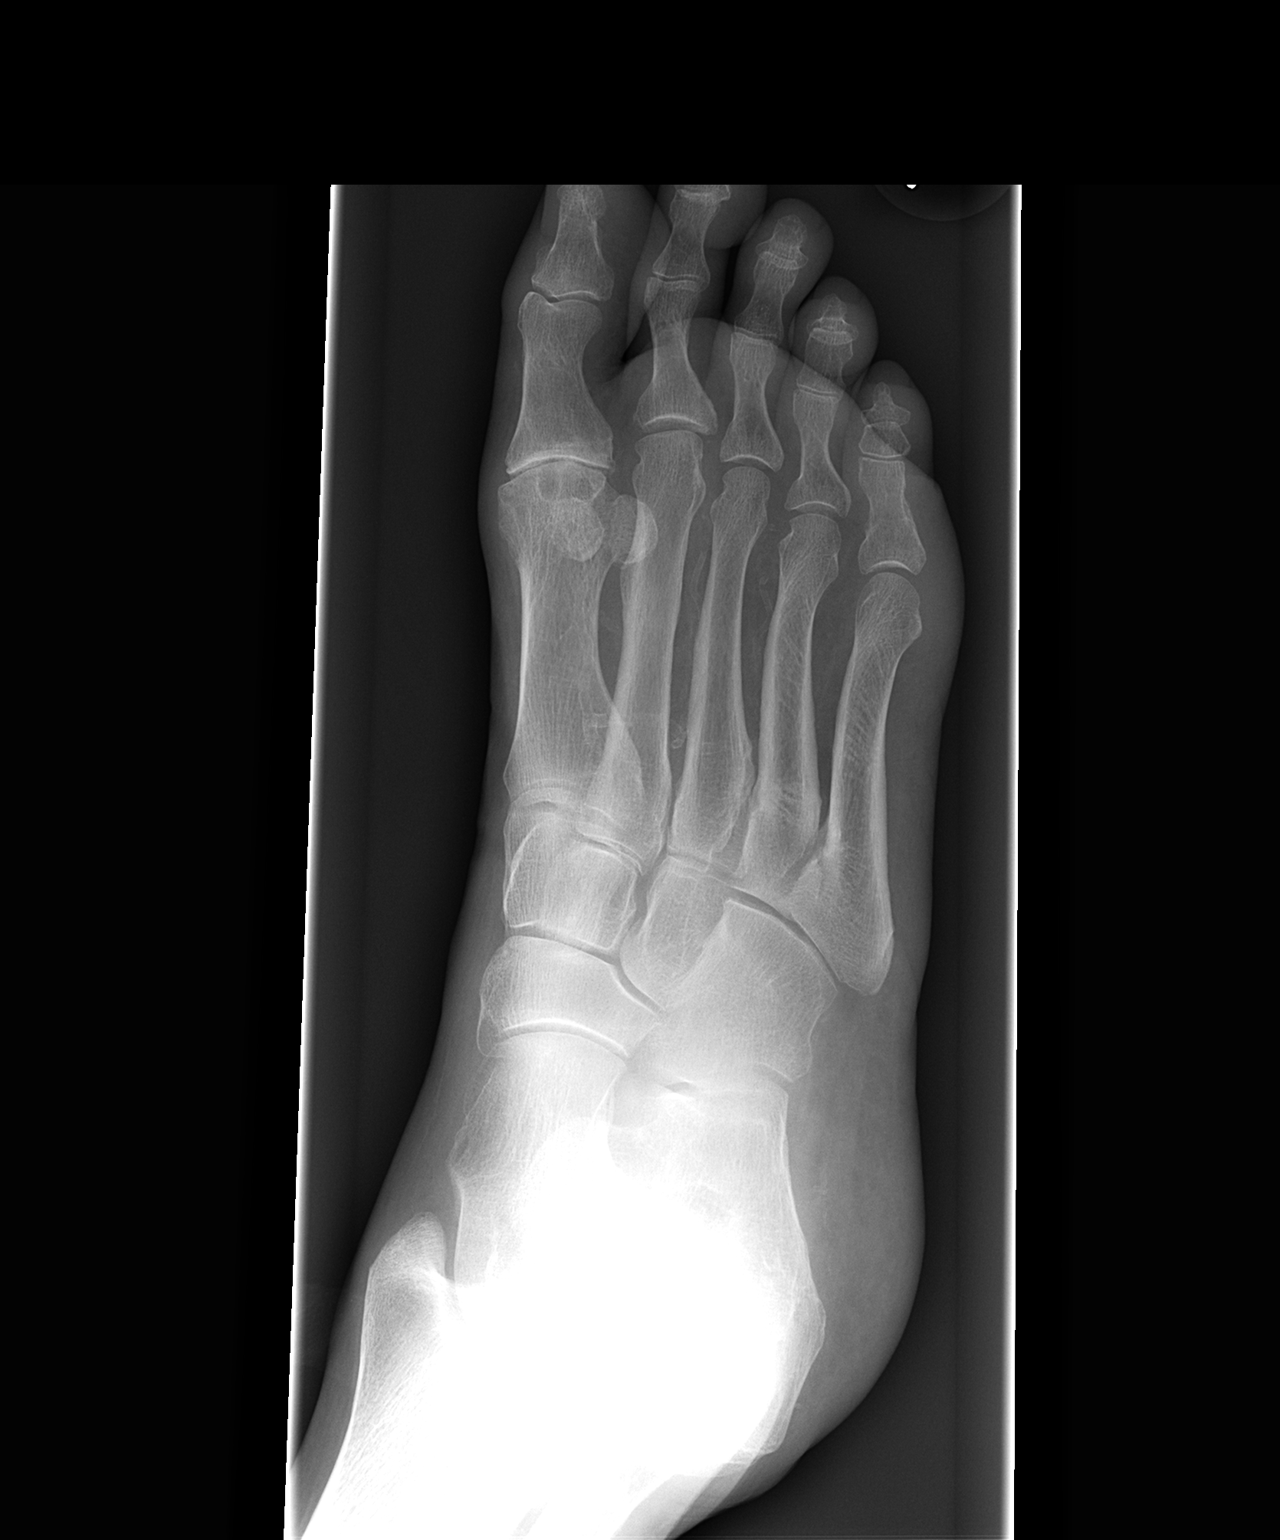

[view not recorded (3 of 3)]
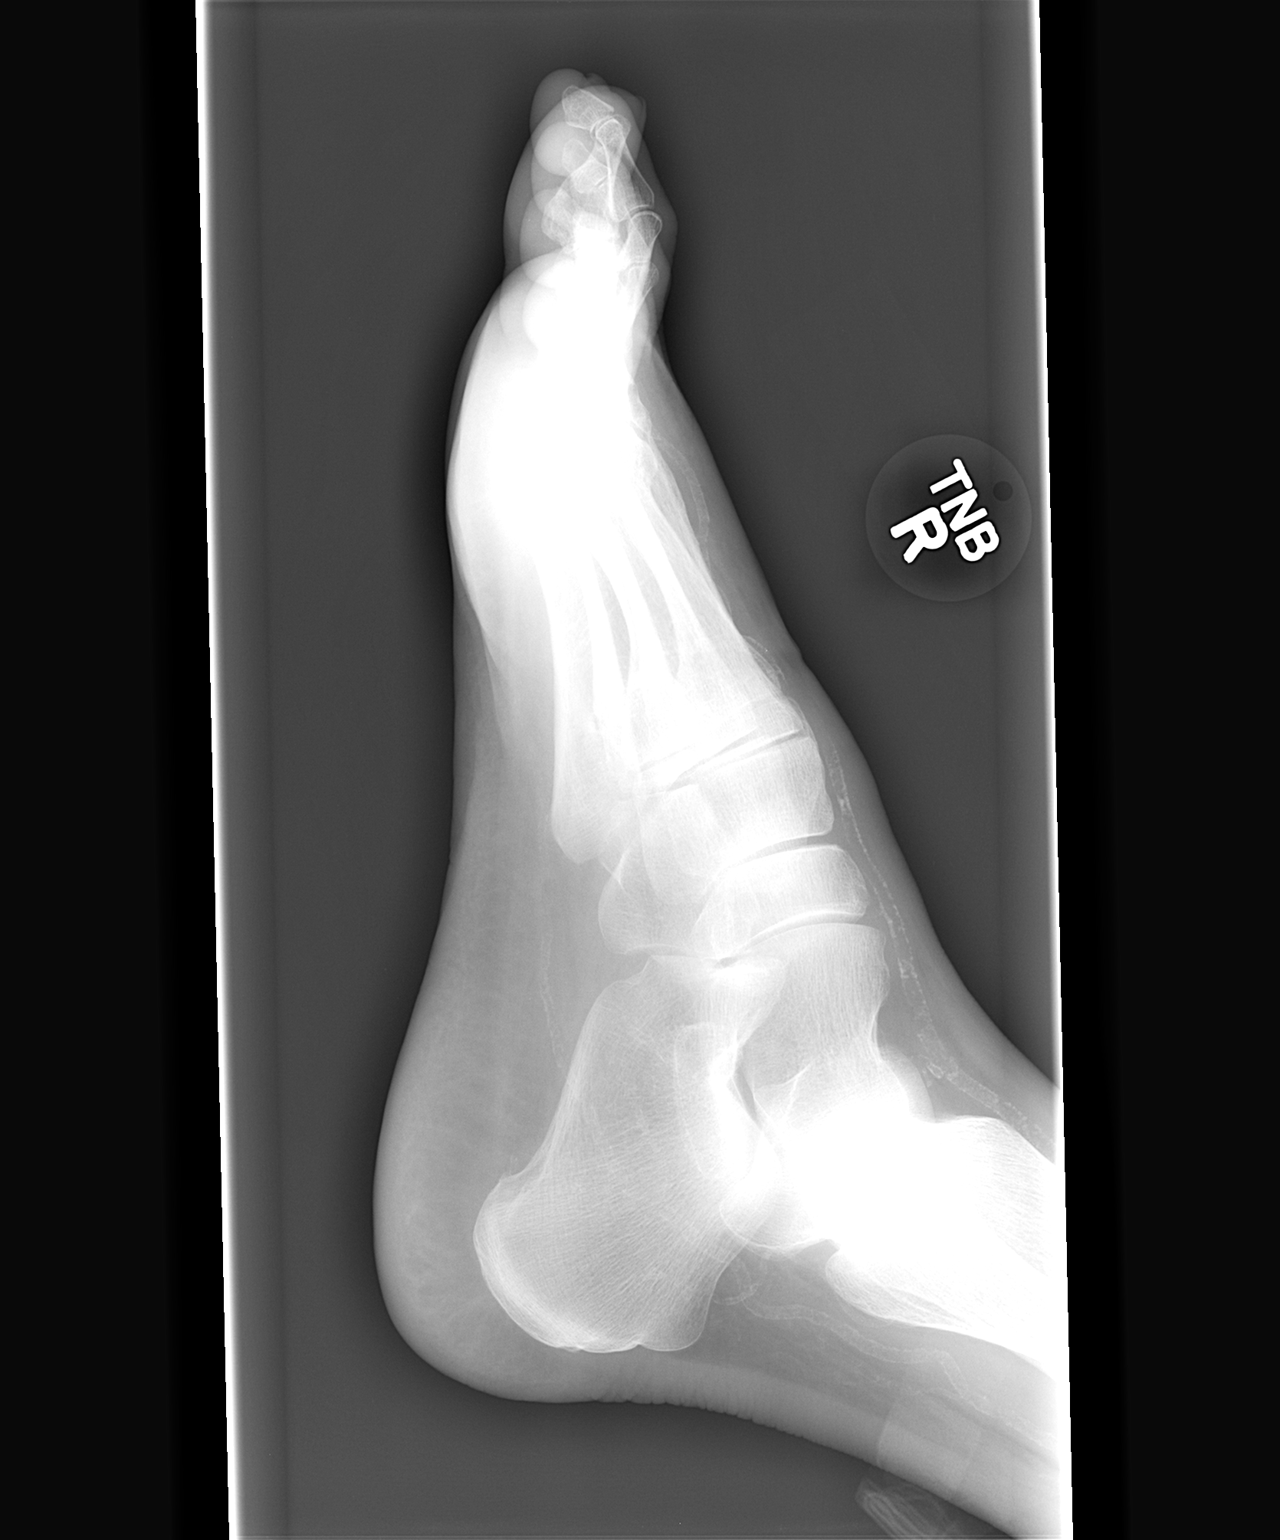

[3 of 3 positions shown; findings below may reference images not displayed]

FINDINGS: No fracture or dislocation is identified.  The patient
has advanced first MTP degenerative disease.  Atherosclerosis is
noted.
IMPRESSION: 1.  No acute finding.
2.  Advanced first MTP osteoarthritis.
3.  Atherosclerosis.

## 2012-09-22 DIAGNOSIS — R079 Chest pain, unspecified: Secondary | ICD-10-CM

## 2013-02-21 DIAGNOSIS — R05 Cough: Secondary | ICD-10-CM

## 2014-05-21 ENCOUNTER — Encounter (HOSPITAL_COMMUNITY): Payer: Self-pay | Admitting: *Deleted

## 2014-05-21 ENCOUNTER — Inpatient Hospital Stay (HOSPITAL_COMMUNITY)
Admission: AD | Admit: 2014-05-21 | Discharge: 2014-05-24 | DRG: 280 | Disposition: A | Discharge status: Home / Self Care | Payer: Medicare HMO | Source: Other Acute Inpatient Hospital | Attending: Cardiovascular Disease | Admitting: Cardiovascular Disease

## 2014-05-21 DIAGNOSIS — E785 Hyperlipidemia, unspecified: Secondary | ICD-10-CM | POA: Diagnosis present

## 2014-05-21 DIAGNOSIS — I160 Hypertensive urgency: Secondary | ICD-10-CM | POA: Diagnosis present

## 2014-05-21 DIAGNOSIS — I2489 Other forms of acute ischemic heart disease: Secondary | ICD-10-CM | POA: Diagnosis present

## 2014-05-21 DIAGNOSIS — Z87891 Personal history of nicotine dependence: Secondary | ICD-10-CM

## 2014-05-21 DIAGNOSIS — F4310 Post-traumatic stress disorder, unspecified: Secondary | ICD-10-CM | POA: Diagnosis present

## 2014-05-21 DIAGNOSIS — I120 Hypertensive chronic kidney disease with stage 5 chronic kidney disease or end stage renal disease: Secondary | ICD-10-CM | POA: Diagnosis present

## 2014-05-21 DIAGNOSIS — B1920 Unspecified viral hepatitis C without hepatic coma: Secondary | ICD-10-CM | POA: Diagnosis present

## 2014-05-21 DIAGNOSIS — Z944 Liver transplant status: Secondary | ICD-10-CM | POA: Diagnosis not present

## 2014-05-21 DIAGNOSIS — Z96649 Presence of unspecified artificial hip joint: Secondary | ICD-10-CM | POA: Diagnosis not present

## 2014-05-21 DIAGNOSIS — I5031 Acute diastolic (congestive) heart failure: Secondary | ICD-10-CM | POA: Diagnosis present

## 2014-05-21 DIAGNOSIS — I214 Non-ST elevation (NSTEMI) myocardial infarction: Secondary | ICD-10-CM | POA: Diagnosis present

## 2014-05-21 DIAGNOSIS — Z992 Dependence on renal dialysis: Secondary | ICD-10-CM

## 2014-05-21 DIAGNOSIS — N2581 Secondary hyperparathyroidism of renal origin: Secondary | ICD-10-CM | POA: Diagnosis present

## 2014-05-21 DIAGNOSIS — I248 Other forms of acute ischemic heart disease: Secondary | ICD-10-CM | POA: Diagnosis present

## 2014-05-21 DIAGNOSIS — I2510 Atherosclerotic heart disease of native coronary artery without angina pectoris: Secondary | ICD-10-CM | POA: Diagnosis present

## 2014-05-21 DIAGNOSIS — I509 Heart failure, unspecified: Secondary | ICD-10-CM | POA: Diagnosis present

## 2014-05-21 DIAGNOSIS — N186 End stage renal disease: Secondary | ICD-10-CM | POA: Diagnosis present

## 2014-05-21 DIAGNOSIS — Z9861 Coronary angioplasty status: Secondary | ICD-10-CM

## 2014-05-21 DIAGNOSIS — Z79899 Other long term (current) drug therapy: Secondary | ICD-10-CM | POA: Diagnosis not present

## 2014-05-21 DIAGNOSIS — I252 Old myocardial infarction: Secondary | ICD-10-CM

## 2014-05-21 DIAGNOSIS — Z7982 Long term (current) use of aspirin: Secondary | ICD-10-CM | POA: Diagnosis not present

## 2014-05-21 DIAGNOSIS — R079 Chest pain, unspecified: Secondary | ICD-10-CM | POA: Diagnosis present

## 2014-05-21 HISTORY — DX: Liver transplant status: Z94.4

## 2014-05-21 HISTORY — DX: Post-traumatic stress disorder, unspecified: F43.10

## 2014-05-21 HISTORY — DX: Acute myocardial infarction, unspecified: I21.9

## 2014-05-21 HISTORY — DX: Angina pectoris, unspecified: I20.9

## 2014-05-21 MED ORDER — ACETAMINOPHEN 325 MG PO TABS
650.0000 mg | ORAL_TABLET | Freq: Four times a day (QID) | ORAL | Status: DC | PRN
  Administered 2014-05-21: 650 mg via ORAL
  Filled 2014-05-21: qty 2

## 2014-05-22 ENCOUNTER — Encounter (HOSPITAL_COMMUNITY): Payer: Self-pay | Admitting: *Deleted

## 2014-05-22 DIAGNOSIS — I2489 Other forms of acute ischemic heart disease: Secondary | ICD-10-CM

## 2014-05-22 DIAGNOSIS — I509 Heart failure, unspecified: Secondary | ICD-10-CM

## 2014-05-22 DIAGNOSIS — E785 Hyperlipidemia, unspecified: Secondary | ICD-10-CM

## 2014-05-22 DIAGNOSIS — R079 Chest pain, unspecified: Secondary | ICD-10-CM

## 2014-05-22 DIAGNOSIS — I10 Essential (primary) hypertension: Secondary | ICD-10-CM

## 2014-05-22 DIAGNOSIS — I248 Other forms of acute ischemic heart disease: Secondary | ICD-10-CM

## 2014-05-22 DIAGNOSIS — I160 Hypertensive urgency: Secondary | ICD-10-CM | POA: Diagnosis present

## 2014-05-22 DIAGNOSIS — N186 End stage renal disease: Secondary | ICD-10-CM

## 2014-05-22 DIAGNOSIS — I214 Non-ST elevation (NSTEMI) myocardial infarction: Secondary | ICD-10-CM | POA: Diagnosis present

## 2014-05-22 DIAGNOSIS — I5031 Acute diastolic (congestive) heart failure: Secondary | ICD-10-CM

## 2014-05-22 LAB — CBC WITH DIFFERENTIAL/PLATELET
Basophils Absolute: 0 10*3/uL (ref 0.0–0.1)
Basophils Relative: 0 % (ref 0–1)
EOS ABS: 0.2 10*3/uL (ref 0.0–0.7)
EOS PCT: 3 % (ref 0–5)
HCT: 33.3 % — ABNORMAL LOW (ref 39.0–52.0)
HEMOGLOBIN: 10.7 g/dL — AB (ref 13.0–17.0)
Lymphocytes Relative: 28 % (ref 12–46)
Lymphs Abs: 1.5 10*3/uL (ref 0.7–4.0)
MCH: 31.2 pg (ref 26.0–34.0)
MCHC: 32.1 g/dL (ref 30.0–36.0)
MCV: 97.1 fL (ref 78.0–100.0)
MONO ABS: 0.5 10*3/uL (ref 0.1–1.0)
MONOS PCT: 9 % (ref 3–12)
Neutro Abs: 3.2 10*3/uL (ref 1.7–7.7)
Neutrophils Relative %: 60 % (ref 43–77)
Platelets: 150 10*3/uL (ref 150–400)
RBC: 3.43 MIL/uL — ABNORMAL LOW (ref 4.22–5.81)
RDW: 14.3 % (ref 11.5–15.5)
WBC: 5.4 10*3/uL (ref 4.0–10.5)

## 2014-05-22 LAB — CBC
HCT: 32.9 % — ABNORMAL LOW (ref 39.0–52.0)
Hemoglobin: 11.1 g/dL — ABNORMAL LOW (ref 13.0–17.0)
MCH: 32.4 pg (ref 26.0–34.0)
MCHC: 33.7 g/dL (ref 30.0–36.0)
MCV: 95.9 fL (ref 78.0–100.0)
PLATELETS: 166 10*3/uL (ref 150–400)
RBC: 3.43 MIL/uL — AB (ref 4.22–5.81)
RDW: 13.9 % (ref 11.5–15.5)
WBC: 5.9 10*3/uL (ref 4.0–10.5)

## 2014-05-22 LAB — BASIC METABOLIC PANEL
Anion gap: 16 — ABNORMAL HIGH (ref 5–15)
BUN: 49 mg/dL — ABNORMAL HIGH (ref 6–23)
CO2: 20 mEq/L (ref 19–32)
CREATININE: 6.53 mg/dL — AB (ref 0.50–1.35)
Calcium: 8.9 mg/dL (ref 8.4–10.5)
Chloride: 98 mEq/L (ref 96–112)
GFR calc Af Amer: 9 mL/min — ABNORMAL LOW (ref >90)
GFR calc non Af Amer: 8 mL/min — ABNORMAL LOW (ref >90)
Glucose, Bld: 146 mg/dL — ABNORMAL HIGH (ref 70–99)
POTASSIUM: 5 meq/L (ref 3.7–5.3)
Sodium: 134 mEq/L — ABNORMAL LOW (ref 137–147)

## 2014-05-22 LAB — LIPID PANEL
Cholesterol: 155 mg/dL (ref 0–200)
HDL: 37 mg/dL — ABNORMAL LOW (ref >39)
LDL CALC: 109 mg/dL — AB (ref 0–99)
Total CHOL/HDL Ratio: 4.2 RATIO
Triglycerides: 47 mg/dL (ref <150)
VLDL: 9 mg/dL (ref 0–40)

## 2014-05-22 LAB — COMPREHENSIVE METABOLIC PANEL
ALBUMIN: 3 g/dL — AB (ref 3.5–5.2)
ALK PHOS: 86 U/L (ref 39–117)
ALT: 9 U/L (ref 0–53)
AST: 19 U/L (ref 0–37)
Anion gap: 16 — ABNORMAL HIGH (ref 5–15)
BILIRUBIN TOTAL: 0.8 mg/dL (ref 0.3–1.2)
BUN: 37 mg/dL — ABNORMAL HIGH (ref 6–23)
CHLORIDE: 98 meq/L (ref 96–112)
CO2: 20 mEq/L (ref 19–32)
Calcium: 8.5 mg/dL (ref 8.4–10.5)
Creatinine, Ser: 5.93 mg/dL — ABNORMAL HIGH (ref 0.50–1.35)
GFR calc non Af Amer: 9 mL/min — ABNORMAL LOW (ref >90)
GFR, EST AFRICAN AMERICAN: 11 mL/min — AB (ref >90)
GLUCOSE: 87 mg/dL (ref 70–99)
POTASSIUM: 4.5 meq/L (ref 3.7–5.3)
SODIUM: 134 meq/L — AB (ref 137–147)
TOTAL PROTEIN: 6.6 g/dL (ref 6.0–8.3)

## 2014-05-22 LAB — MAGNESIUM: Magnesium: 1.9 mg/dL (ref 1.5–2.5)

## 2014-05-22 LAB — PROTIME-INR
INR: 1.14 (ref 0.00–1.49)
Prothrombin Time: 14.6 seconds (ref 11.6–15.2)

## 2014-05-22 LAB — HEPARIN LEVEL (UNFRACTIONATED)

## 2014-05-22 LAB — TROPONIN I
TROPONIN I: 0.42 ng/mL — AB (ref <0.30)
Troponin I: 0.33 ng/mL (ref <0.30)
Troponin I: 0.33 ng/mL (ref <0.30)

## 2014-05-22 LAB — PLATELET INHIBITION P2Y12: PLATELET FUNCTION P2Y12: 297 [PRU] (ref 194–418)

## 2014-05-22 MED ORDER — HEPARIN (PORCINE) IN NACL 100-0.45 UNIT/ML-% IJ SOLN
1500.0000 [IU]/h | INTRAMUSCULAR | Status: DC
  Administered 2014-05-22: 1500 [IU]/h via INTRAVENOUS
  Administered 2014-05-22: 1200 [IU]/h via INTRAVENOUS
  Filled 2014-05-22 (×3): qty 250

## 2014-05-22 MED ORDER — HEPARIN BOLUS VIA INFUSION
3000.0000 [IU] | Freq: Once | INTRAVENOUS | Status: AC
  Administered 2014-05-22: 3000 [IU] via INTRAVENOUS
  Filled 2014-05-22: qty 3000

## 2014-05-22 MED ORDER — MYCOPHENOLATE MOFETIL 250 MG PO CAPS
250.0000 mg | ORAL_CAPSULE | Freq: Two times a day (BID) | ORAL | Status: DC
  Administered 2014-05-22 – 2014-05-24 (×5): 250 mg via ORAL
  Filled 2014-05-22 (×7): qty 1

## 2014-05-22 MED ORDER — SODIUM CHLORIDE 0.9 % IV SOLN: 250.0000 mL | INTRAVENOUS | Status: DC | PRN

## 2014-05-22 MED ORDER — SODIUM CHLORIDE 0.9 % IJ SOLN: 3.0000 mL | INTRAMUSCULAR | Status: DC | PRN

## 2014-05-22 MED ORDER — SODIUM BICARBONATE 650 MG PO TABS
650.0000 mg | ORAL_TABLET | Freq: Two times a day (BID) | ORAL | Status: DC
Start: 2014-05-22 — End: 2014-05-24
  Administered 2014-05-22 – 2014-05-24 (×5): 650 mg via ORAL
  Filled 2014-05-22 (×6): qty 1

## 2014-05-22 MED ORDER — CALCIUM ACETATE 667 MG PO CAPS
1334.0000 mg | ORAL_CAPSULE | Freq: Three times a day (TID) | ORAL | Status: DC
  Administered 2014-05-22 – 2014-05-24 (×4): 1334 mg via ORAL
  Filled 2014-05-22 (×10): qty 2

## 2014-05-22 MED ORDER — SODIUM CHLORIDE 0.9 % IJ SOLN
3.0000 mL | Freq: Two times a day (BID) | INTRAMUSCULAR | Status: DC
  Administered 2014-05-23: 3 mL via INTRAVENOUS

## 2014-05-22 MED ORDER — SODIUM CHLORIDE 0.9 % IV SOLN
INTRAVENOUS | Status: DC
  Administered 2014-05-23: 10 mL/h via INTRAVENOUS

## 2014-05-22 MED ORDER — HEPARIN BOLUS VIA INFUSION
2000.0000 [IU] | Freq: Once | INTRAVENOUS | Status: AC
  Administered 2014-05-22: 2000 [IU] via INTRAVENOUS
  Filled 2014-05-22: qty 2000

## 2014-05-22 MED ORDER — CARVEDILOL 3.125 MG PO TABS
3.1250 mg | ORAL_TABLET | Freq: Two times a day (BID) | ORAL | Status: DC
  Administered 2014-05-22 (×2): 3.125 mg via ORAL
  Filled 2014-05-22 (×3): qty 1

## 2014-05-22 MED ORDER — CARVEDILOL 12.5 MG PO TABS
12.5000 mg | ORAL_TABLET | Freq: Two times a day (BID) | ORAL | Status: DC
  Administered 2014-05-22 – 2014-05-24 (×2): 12.5 mg via ORAL
  Filled 2014-05-22 (×6): qty 1

## 2014-05-22 MED ORDER — FLUOXETINE HCL 10 MG PO CAPS
10.0000 mg | ORAL_CAPSULE | Freq: Every day | ORAL | Status: DC
  Administered 2014-05-22: 10 mg via ORAL
  Filled 2014-05-22: qty 1

## 2014-05-22 MED ORDER — PANTOPRAZOLE SODIUM 40 MG PO TBEC
40.0000 mg | DELAYED_RELEASE_TABLET | Freq: Every day | ORAL | Status: DC
  Administered 2014-05-22 – 2014-05-23 (×2): 40 mg via ORAL
  Filled 2014-05-22 (×2): qty 1

## 2014-05-22 MED ORDER — SODIUM CHLORIDE 0.9 % IJ SOLN
3.0000 mL | Freq: Two times a day (BID) | INTRAMUSCULAR | Status: DC
  Administered 2014-05-22: 3 mL via INTRAVENOUS

## 2014-05-22 MED ORDER — NON FORMULARY: 80.0000 mg | Freq: Every day | Status: DC

## 2014-05-22 MED ORDER — ASPIRIN EC 81 MG PO TBEC
81.0000 mg | DELAYED_RELEASE_TABLET | Freq: Every day | ORAL | Status: DC
  Administered 2014-05-24: 81 mg via ORAL
  Filled 2014-05-22 (×2): qty 1

## 2014-05-22 MED ORDER — BUSPIRONE HCL 5 MG PO TABS
5.0000 mg | ORAL_TABLET | Freq: Two times a day (BID) | ORAL | Status: DC
  Administered 2014-05-22 – 2014-05-24 (×4): 5 mg via ORAL
  Filled 2014-05-22 (×5): qty 1

## 2014-05-22 MED ORDER — FLUOXETINE HCL 20 MG PO CAPS
40.0000 mg | ORAL_CAPSULE | Freq: Every day | ORAL | Status: DC
  Administered 2014-05-23: 40 mg via ORAL
  Filled 2014-05-22 (×2): qty 2

## 2014-05-22 MED ORDER — NITROGLYCERIN 0.4 MG SL SUBL: 0.4000 mg | SUBLINGUAL_TABLET | SUBLINGUAL | Status: DC | PRN

## 2014-05-22 MED ORDER — PRAVASTATIN SODIUM 40 MG PO TABS
80.0000 mg | ORAL_TABLET | Freq: Every day | ORAL | Status: DC
  Administered 2014-05-22 – 2014-05-23 (×2): 80 mg via ORAL
  Filled 2014-05-22 (×3): qty 2

## 2014-05-22 MED ORDER — FLUOXETINE HCL 20 MG PO CAPS
30.0000 mg | ORAL_CAPSULE | Freq: Once | ORAL | Status: AC
  Administered 2014-05-22: 30 mg via ORAL
  Filled 2014-05-22: qty 1

## 2014-05-22 MED ORDER — ASPIRIN 81 MG PO CHEW
81.0000 mg | CHEWABLE_TABLET | ORAL | Status: AC
  Administered 2014-05-23: 81 mg via ORAL
  Filled 2014-05-22: qty 1

## 2014-05-22 NOTE — Progress Notes (Signed)
Utilization review completed.  

## 2014-05-22 NOTE — Progress Notes (Signed)
ANTICOAGULATION CONSULT NOTE - Follow Up Consult  Pharmacy Consult for Heparin Indication: NSTEMI  Allergies  Allergen Reactions  . Codeine     Patient Measurements: Height:  (180.3 cm) Weight: 196 lb 1.6 oz (88.95 kg) IBW/kg (Calculated) : 75.3 Heparin Dosing Weight: 89 kg  Vital Signs: Temp: 98.9 F (37.2 C) (09/13 1501) Temp src: Oral (09/13 1501) BP: 164/78 mmHg (09/13 0616) Pulse Rate: 75 (09/13 1501)  Labs:  Recent Labs  05/22/14 0347 05/22/14 1350 05/22/14 1356  HGB 10.7*  --   --   HCT 33.3*  --   --   PLT 150  --   --   HEPARINUNFRC  --  <0.10*  --   CREATININE 5.93*  --   --   TROPONINI 0.42*  --  0.33*    Estimated Creatinine Clearance: 13.6 ml/min (by C-G formula based on Cr of 5.93).   Medications:  Heparin @ 1200 units/hr  Assessment: 63 YOM on heparin for NSTEMI while awaiting cardiac cath with a SUBtherapeutic heparin level this afternoon (HL<0.1, goal of 0.3-0.7). Noted plans for cardiac cath on Monday, 9/14. CBC okay - no overt s/sx of bleeding noted.  Goal of Therapy:  Heparin level 0.3-0.7 units/ml Monitor platelets by anticoagulation protocol: Yes   Plan:  1. Heparin bolus of 2000 units x 1 2. Increase heparin drip rate to 1500 units/hr 3. Will continue to monitor for any signs/symptoms of bleeding and will follow up with heparin level in 8 hours   Georgina Pillion, PharmD, BCPS Clinical Pharmacist Pager: [phone] 05/22/2014 3:15 PM

## 2014-05-22 NOTE — Progress Notes (Signed)
SUBJECTIVE:  No further CP  OBJECTIVE:   Vitals:   Filed Vitals:   05/21/14 2310 05/22/14 0245 05/22/14 0616  BP: 149/89 138/73 164/78  Pulse: 79 67 65  Temp: 98.7 F (37.1 C) 98.6 F (37 C)   TempSrc: Oral Oral   Resp: 18 20   Height:  (1.803 m)    Weight: 196 lb 1.6 oz (88.95 kg)    SpO2: 96% 98%    I&O's:  No intake or output data in the 24 hours ending 05/22/14 0742 TELEMETRY: Reviewed telemetry pt in NSR:     PHYSICAL EXAM General: Well developed, well nourished, in no acute distress Head: Eyes PERRLA, No xanthomas.   Normal cephalic and atramatic  Lungs:   Clear bilaterally to auscultation and percussion. Heart:   HRRR S1 S2 Pulses are 2+ & equal. Abdomen: Bowel sounds are positive, abdomen soft and non-tender without masses Extremities:   No clubbing, cyanosis or edema.  DP +1 Neuro: Alert and oriented X 3. Psych:  Good affect, responds appropriately   LABS: Basic Metabolic Panel:  Recent Labs  16/10/96 0347  NA 134*  K 4.5  CL 98  CO2 20  GLUCOSE 87  BUN 37*  CREATININE 5.93*  CALCIUM 8.5  MG 1.9   Liver Function Tests:  Recent Labs  05/22/14 0347  AST 19  ALT 9  ALKPHOS 86  BILITOT 0.8  PROT 6.6  ALBUMIN 3.0*   No results found for this basename: LIPASE, AMYLASE,  in the last 72 hours CBC:  Recent Labs  05/22/14 0347  WBC 5.4  NEUTROABS 3.2  HGB 10.7*  HCT 33.3*  MCV 97.1  PLT 150   Cardiac Enzymes:  Recent Labs  05/22/14 0347  TROPONINI 0.42*   BNP: No components found with this basename: POCBNP,  D-Dimer: No results found for this basename: DDIMER,  in the last 72 hours Hemoglobin A1C: No results found for this basename: HGBA1C,  in the last 72 hours Fasting Lipid Panel:  Recent Labs  05/22/14 0347  CHOL 155  HDL 37*  LDLCALC 109*  TRIG 47  CHOLHDL 4.2   Thyroid Function Tests: No results found for this basename: TSH, T4TOTAL, FREET3, T3FREE, THYROIDAB,  in the last 72 hours Anemia Panel: No  results found for this basename: VITAMINB12, FOLATE, FERRITIN, TIBC, IRON, RETICCTPCT,  in the last 72 hours Coag Panel:   No results found for this basename: INR, PROTIME    RADIOLOGY: No results found.    ASSESSMENT/PLAN: 63 y.o. male w/ PMHx significant for ESRD on MWF dialysis (?due to cyclosporine), ESLD s/p transplant in 1989, CAD s/p LAD stent 2009, who presented to [facility] with elevated BP in the 190s and chest pain --> elevated troponin.   Problem List  1. Chest pain, --> elevated troponin - unclear whether this represents a true NSTEMI from progression of CAD and unstable plaque vs. demand ischemia from hypertensive urgency in the setting of ESRD and CHF.  Initial troponin is only mildly elevated so will need to see the trend. Will make NPO after MN for cath in am to rule out progression of CAD.  Continue ASA/BB/IV Heparin gtt/statin 2. Labile BP - BP improved from admission 3. ESRD on MWF dialysis  4. S/p liver transplant  5. Known CAD s/p stent to LAD with patent stent, 50% prox circ, 20% prox and distal RCA and 99% small PDA at cath 09/2012 6. Hep C 7. Acute diastolic CHF from hypertensive urgency in  the setting of volume overload from ESRD.  He gets HD M/W/F - will consult renal to see today.  Will get an echo to assess LVF.   Quintella Reichert, MD  05/22/2014  7:42 AM

## 2014-05-22 NOTE — Progress Notes (Signed)
I was unable to located an additional 2nd IV access for this pt for his catheterization tomorrow, as he has a restricted left extremity and some swelling to his left hand and forearm.  I contacted IV team to obtain an IV.

## 2014-05-22 NOTE — Progress Notes (Signed)
ANTICOAGULATION CONSULT NOTE - Initial Consult  Pharmacy Consult for heparin Indication: NSTEMI  Allergies  Allergen Reactions  . Codeine     Patient Measurements: Height:  (180.3 cm) Weight: 196 lb 1.6 oz (88.95 kg) IBW/kg (Calculated) : 75.3  Vital Signs: Temp: 98.7 F (37.1 C) (09/12 2310) Temp src: Oral (09/12 2310) BP: 149/89 mmHg (09/12 2310) Pulse Rate: 79 (09/12 2310)    Medical History: Past Medical History  Diagnosis Date  . CAD (coronary artery disease)     Single Vessel  . CKD (chronic kidney disease)   . Chronic anemia   . Hepatitis C   . Chronic anemia   . History of syncope   . History of hyperkalemia   . Gout   . Generalized headaches     Secondary to Imdur  . History of substance abuse     Cannabis  . Myocardial infarction 2009  . Anginal pain   . Post traumatic stress disorder   . Liver transplant recipient 20 years ago    Medications:  Prescriptions prior to admission  Medication Sig Dispense Refill  . aspirin 81 MG tablet Take 81 mg by mouth daily.      . calcium acetate (PHOSLO) 667 MG capsule Take 1,334 mg by mouth 3 (three) times daily with meals.      Marland Kitchen FLUoxetine (PROZAC) 10 MG capsule Take 10 mg by mouth daily.      . mycophenolate (CELLCEPT) 250 MG capsule Take 250 mg by mouth 2 (two) times daily.      Marland Kitchen omeprazole (PRILOSEC) 20 MG capsule Take 20 mg by mouth daily.      . sodium bicarbonate 325 MG tablet Take 650 mg by mouth 2 (two) times daily.      . [DISCONTINUED] amLODipine-benazepril (LOTREL) 10-20 MG per capsule Take 1 capsule by mouth daily.       Scheduled:  . [START ON 05/23/2014] aspirin EC  81 mg Oral Daily  . calcium acetate  1,334 mg Oral TID WC  . carvedilol  3.125 mg Oral BID WC  . FLUoxetine  10 mg Oral Daily  . mycophenolate  250 mg Oral BID  . NON FORMULARY 80 mg  80 mg Oral QHS  . pantoprazole  40 mg Oral Daily  . sodium bicarbonate  650 mg Oral BID  . sodium chloride  3 mL Intravenous Q12H     Assessment: 63yo male w/ ESRD presented to [facility] w/ CP and BP to 190s, describes CP as different from his normal angina, troponin mildly elevated at [facility], to begin heparin given TIMI score.  Goal of Therapy:  Heparin level 0.3-0.7 units/ml Monitor platelets by anticoagulation protocol: Yes   Plan:  Will give heparin 3000 units IV bolus x1 followed by gtt at 1200 units/hr and monitor heparin levels and CBC.  Vernard Gambles, PharmD, BCPS  05/22/2014,2:00 AM

## 2014-05-22 NOTE — H&P (Signed)
CARDIOLOGY Admission NOTE  Patient ID: Jose Ide., MRN: [id number], DOB/AGE: Sep 17, 1950 63 y.o. Admit date: 05/21/2014 Date of Consult: 05/22/2014  Primary Physician: Kirk Ruths, MD Primary Cardiologist: previously seen by Dr. Earnestine Leys  Gets his liver care at [facility]  Chief Complaint: chest pain   HPI: 63 y.o. male w/ PMHx significant for ESRD on MWF dialysis (?due to cyclosporine), ESLD s/p transplant in 1989, CAD s/p LAD stent 2009,  who presented to [facility] hospital with elevated BP in the 190s and chest pain.   Review of his outside records from [facility] indicate a similar hospitalization in 09/2012 when he was admitted and underwent cardiac cath which noted the findings below. This was prior to starting dialysis. Was discharged on several cardiac medications including prn clonidine, amlodpine and carvedilol, ISDN. After starting dialysis, all of his BP meds were removed.  He reports over the last several weeks, he has had difficulty with increased BP and headaches. He reports in dialysis that they are pulling less fluid due to these issues (unclear reasoning). He also recently underwent liver biopsy at [facility] in [place].  He reports 2 different chest pains. One is what he thinks is his angina- a sharp left sided chest pain that lasts for several minutes but aborts if he takes a nitro. Current symptoms are different: describes it as a chest pressure, center of his chest. Has not tried nitro. Assoc with elevated BPs.  Currently is chest pain free.  CareEverywhere notes: 09/2012: cardiac catheterization which showed a patent prior stent site in the LAD,a 50% proximal circumflex , and 20 % proximal and distal RCA lesions with a 99% mid PDA lesion- the vessel too small for intervention.   Outside cxray: mild congestion  Outside labs BNP 2155 BUN 34 Cr 5.6 K 4.3 Troponin 0.73 CPK 88 MB 1.7 INR 1  WBC 5.8 Hct 26 Plt 147  EKG with NSR, no ST or TW changes   Past  Medical History  Diagnosis Date  . CAD (coronary artery disease)     Single Vessel  . CKD (chronic kidney disease)   . Chronic anemia   . Hepatitis C   . Chronic anemia   . History of syncope   . History of hyperkalemia   . Gout   . Generalized headaches     Secondary to Imdur  . History of substance abuse     Cannabis  . Myocardial infarction 2009  . Anginal pain   . Post traumatic stress disorder   . 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 Joint replacement  1989    Hip  . Broken femur with metal rod from accident       Home Meds: Prior to Admission medications   Medication Sig Start Date End Date Taking? Authorizing Provider  aspirin 81 MG tablet Take 81 mg by mouth daily.   Yes Historical Provider, MD  calcium acetate (PHOSLO) 667 MG capsule Take 1,334 mg by mouth 3 (three) times daily with meals.   Yes Historical Provider, MD  FLUoxetine (PROZAC) 10 MG capsule Take 10 mg by mouth daily.   Yes Historical Provider, MD  mycophenolate (CELLCEPT) 250 MG capsule Take 250 mg by mouth 2 (two) times daily.   Yes Historical Provider, MD  omeprazole (PRILOSEC) 20 MG capsule Take 20 mg by mouth daily.   Yes Historical Provider, MD  sodium bicarbonate 325 MG  tablet Take 650 mg by mouth 2 (two) times daily.   Yes Historical Provider, MD  Pravastatin 80 mg qda  Inpatient Medications:      Allergies:  Allergies  Allergen Reactions  . Codeine     History   Social History  . Marital Status: Married    Spouse Name: N/A    Number of Children: N/A  . Years of Education: N/A   Occupational History  . Not on file.   Social History Main Topics  . Smoking status: Former Smoker    Quit date: 09/09/1981  . Smokeless tobacco: Not on file  . Alcohol Use: No  . Drug Use: No  . Sexual Activity: Not on file   Other Topics Concern  . Not on file   Social History Narrative  . No narrative on file      No family history on file.   Review of Systems: General: negative for chills, fever, night sweats or weight changes.  Cardiovascular: see HPI Dermatological: negative for rash Respiratory: negative for cough or wheezing Urologic: negative for hematuria Abdominal: negative for nausea, vomiting, diarrhea, bright red blood per rectum, melena, or hematemesis Neurologic: negative for visual changes, syncope, or dizziness, +headaches All other systems reviewed and are otherwise negative except as noted above.  Labs: See HPI  Radiology/Studies:  No results found.  EKG: see HPI  Physical Exam: Blood pressure 149/89, pulse 79, temperature 98.7 F (37.1 C), temperature source Oral, resp. rate 18, height  (1.803 m), weight 88.95 kg (196 lb 1.6 oz), SpO2 96.00%. General: Well developed, well nourished, in no acute distress. Head: Normocephalic, atraumatic, sclera non-icteric, no xanthomas, nares are without discharge.  Neck: Supple. Negative for carotid bruits. JVD not elevated. Lungs: Clear bilaterally to auscultation without wheezes, rales, or rhonchi. Breathing is unlabored. Heart: RRR with S1 S2. No murmurs, rubs, or gallops appreciated. Abdomen: Soft, non-tender, non-distended with normoactive bowel sounds. No hepatomegaly. No rebound/guarding. No obvious abdominal masses. Msk:  Strength and tone appear normal for age. Extremities: No clubbing or cyanosis. No edema.  Distal pedal pulses are 2+ and equal bilaterally. Neuro: Alert and oriented X 3. Moves all extremities spontaneously. Psych:  Responds to questions appropriately with a normal affect.   Assessment and Plan:  Problem List 1. Chest pain, --> elevated troponin c/w NSTEMI 2. Labile BP 3. ESRD on MWF dialysis 4. S/p liver transplant 5. Known CAD s/p stent 6. Hep C  63 y.o. male w/ PMHx significant for ESRD on MWF dialysis (?due to cyclosporine), ESLD s/p transplant in 1989, CAD s/p LAD stent 2009,  who presented  to [facility] hospital with elevated BP in the 190s and chest pain --> elevated troponin.  Encouragingly, he is now chest pain free and BP is improved to 149 systolic. Unclear if is chest pain is from new atherosclerotic disease and unstable plaque versus his known severe PDA disease causing ischemia in the setting of elevated blood pressures. At this time, continue aspirin, trend troponins. Add BB. Continue statin (limited due to cellcept). Add heparin due to elevated TIMI score. Cath may be warranted.  Unclear why his BPs are so labile and unclear why dialysis is pulling off less fluid when it appears from my history (and chest xray) that more fluid should be removed. Will add low dose BB blocker and consider adding additional agents as needed. Will need to get renal involved especially if intervention planned (MWF dialysis).  Continue liver rejection meds.   NPO just in case  procedure needed which is unlikely tomorrow.  Full code.  Signed, Nikiyah Fackler C. MD 05/22/2014, 1:06 AM

## 2014-05-23 ENCOUNTER — Encounter (HOSPITAL_COMMUNITY)
Admission: AD | Disposition: A | Discharge status: Home / Self Care | Payer: Self-pay | Source: Other Acute Inpatient Hospital | Attending: Cardiovascular Disease

## 2014-05-23 DIAGNOSIS — I2510 Atherosclerotic heart disease of native coronary artery without angina pectoris: Secondary | ICD-10-CM

## 2014-05-23 HISTORY — PX: LEFT HEART CATHETERIZATION WITH CORONARY ANGIOGRAM: SHX5451

## 2014-05-23 LAB — RENAL FUNCTION PANEL
Albumin: 3 g/dL — ABNORMAL LOW (ref 3.5–5.2)
Anion gap: 14 (ref 5–15)
BUN: 59 mg/dL — AB (ref 6–23)
CO2: 20 mEq/L (ref 19–32)
Calcium: 8.7 mg/dL (ref 8.4–10.5)
Chloride: 98 mEq/L (ref 96–112)
Creatinine, Ser: 7.58 mg/dL — ABNORMAL HIGH (ref 0.50–1.35)
GFR calc non Af Amer: 7 mL/min — ABNORMAL LOW (ref >90)
GFR, EST AFRICAN AMERICAN: 8 mL/min — AB (ref >90)
GLUCOSE: 109 mg/dL — AB (ref 70–99)
PHOSPHORUS: 4.3 mg/dL (ref 2.3–4.6)
Potassium: 4.9 mEq/L (ref 3.7–5.3)
SODIUM: 132 meq/L — AB (ref 137–147)

## 2014-05-23 LAB — CBC
HCT: 31.8 % — ABNORMAL LOW (ref 39.0–52.0)
Hemoglobin: 10.6 g/dL — ABNORMAL LOW (ref 13.0–17.0)
MCH: 31.9 pg (ref 26.0–34.0)
MCHC: 33.3 g/dL (ref 30.0–36.0)
MCV: 95.8 fL (ref 78.0–100.0)
PLATELETS: 167 10*3/uL (ref 150–400)
RBC: 3.32 MIL/uL — ABNORMAL LOW (ref 4.22–5.81)
RDW: 13.8 % (ref 11.5–15.5)
WBC: 5.5 10*3/uL (ref 4.0–10.5)

## 2014-05-23 LAB — HEPARIN LEVEL (UNFRACTIONATED): HEPARIN UNFRACTIONATED: 0.38 [IU]/mL (ref 0.30–0.70)

## 2014-05-23 LAB — GLUCOSE, CAPILLARY: Glucose-Capillary: 157 mg/dL — ABNORMAL HIGH (ref 70–99)

## 2014-05-23 LAB — MRSA PCR SCREENING: MRSA BY PCR: POSITIVE — AB

## 2014-05-23 SURGERY — LEFT HEART CATHETERIZATION WITH CORONARY ANGIOGRAM
Anesthesia: LOCAL

## 2014-05-23 MED ORDER — ONDANSETRON HCL 4 MG/2ML IJ SOLN: 4.0000 mg | Freq: Four times a day (QID) | INTRAMUSCULAR | Status: DC | PRN

## 2014-05-23 MED ORDER — DOXERCALCIFEROL 4 MCG/2ML IV SOLN: 6.0000 ug | INTRAVENOUS | Status: DC

## 2014-05-23 MED ORDER — SODIUM CHLORIDE 0.9 % IV SOLN
125.0000 mg | INTRAVENOUS | Status: DC
  Administered 2014-05-23: 125 mg via INTRAVENOUS
  Filled 2014-05-23 (×2): qty 10

## 2014-05-23 MED ORDER — NITROGLYCERIN 1 MG/10 ML FOR IR/CATH LAB
INTRA_ARTERIAL | Status: AC
  Filled 2014-05-23: qty 10

## 2014-05-23 MED ORDER — LIDOCAINE HCL (PF) 1 % IJ SOLN
INTRAMUSCULAR | Status: AC
  Filled 2014-05-23: qty 30

## 2014-05-23 MED ORDER — CHLORHEXIDINE GLUCONATE CLOTH 2 % EX PADS
6.0000 | MEDICATED_PAD | Freq: Every day | CUTANEOUS | Status: DC
  Administered 2014-05-24: 6 via TOPICAL

## 2014-05-23 MED ORDER — FENTANYL CITRATE 0.05 MG/ML IJ SOLN
INTRAMUSCULAR | Status: AC
  Filled 2014-05-23: qty 2

## 2014-05-23 MED ORDER — SODIUM CHLORIDE 0.9 % IJ SOLN: 3.0000 mL | Freq: Two times a day (BID) | INTRAMUSCULAR | Status: DC

## 2014-05-23 MED ORDER — MUPIROCIN 2 % EX OINT
1.0000 "application " | TOPICAL_OINTMENT | Freq: Two times a day (BID) | CUTANEOUS | Status: DC
  Administered 2014-05-23 – 2014-05-24 (×2): 1 via NASAL
  Filled 2014-05-23: qty 22

## 2014-05-23 MED ORDER — HEPARIN SODIUM (PORCINE) 1000 UNIT/ML IJ SOLN
2000.0000 [IU] | Freq: Once | INTRAMUSCULAR | Status: AC
  Administered 2014-05-23: 2000 [IU] via INTRAVENOUS

## 2014-05-23 MED ORDER — SODIUM CHLORIDE 0.9 % IV SOLN: 250.0000 mL | INTRAVENOUS | Status: DC | PRN

## 2014-05-23 MED ORDER — MIDAZOLAM HCL 2 MG/2ML IJ SOLN
INTRAMUSCULAR | Status: AC
  Filled 2014-05-23: qty 2

## 2014-05-23 MED ORDER — HEPARIN (PORCINE) IN NACL 2-0.9 UNIT/ML-% IJ SOLN
INTRAMUSCULAR | Status: AC
  Filled 2014-05-23: qty 1000

## 2014-05-23 MED ORDER — SODIUM CHLORIDE 0.9 % IJ SOLN: 3.0000 mL | INTRAMUSCULAR | Status: DC | PRN

## 2014-05-23 NOTE — Progress Notes (Signed)
Sheath pulled in Cath lab.  Pt tolerated well, VS remain stable, and pt denied any discomfort at this time.  Pt to  2 west via stretcher.

## 2014-05-23 NOTE — CV Procedure (Signed)
   Cardiac Catheterization Procedure Note  Name: Jose Mans [facility] Sr. MRN: [id number] DOB: 05/11/1951  Procedure: Left Heart Cath, Selective Coronary Angiography, LV angiography  Indication: Elevated troponin in setting of severe HTN. This is a 63 year old gentleman with end-stage renal disease. Several of his blood pressure medicines have been stopped over recent months as he has started on hemodialysis. Over the last 2 weeks he has noted increasing blood pressure readings. Within the last 48 hours, he developed very high blood pressures with associated chest pain and shortness of breath. His troponin was mildly elevated. He was referred for cardiac catheterization. The patient has known CAD and previous MI in 2009. His underwent LAD stenting. His last heart catheterization was performed at [facility] in 2014 at which time medical therapy was recommended for his CAD.  Procedural details: The right groin was prepped, draped, and anesthetized with 1% lidocaine. Using modified Seldinger technique, a 5 French sheath was introduced into the right femoral artery. Standard Judkins catheters were used for coronary angiography and left ventriculography. Catheter exchanges were performed over a guidewire. There were no immediate procedural complications. The patient was transferred to the post catheterization recovery area for further monitoring.  Procedural Findings: Hemodynamics:  AO 150/73 LV 156/18   Coronary angiography: Coronary dominance: right  Left mainstem: The left main is patent. There is nonobstructive disease present. The distal left main has 20-30% stenosis before it divides into the LAD and left circumflex.  Left anterior descending (LAD): The LAD is patent. There are proximal irregularities noted. The stented segment in the mid LAD is widely patent. The first diagonal has severe diffuse disease with 80-90% stenosis extending into the distal diagonal. This is not treatable by  PCI. The mid LAD beyond the stented segment also has severe diffuse disease extending into the apical portion of the LAD. This is estimated at 80% stenosis.  Left circumflex (LCx): The left circumflex is a large-caliber vessel. The proximal vessel has 50-60% stenosis with mild hypodensity noted. The distal vessel an OM branches are patent with no significant stenosis. The distal OM divides into 2 medium caliber subbranches.  Right coronary artery (RCA): The RCA is a large, dominant vessel. The vessel takes a 360 turn at the junction of the mid and distal RCA. The mid RCA, proximal to the area of tortuosity, has a warty percent stenosis. The PLA branch is patent. The PDA branch has 95% stenosis in its midportion and this has been described on his previous studies.  Left ventriculography: Left ventricular systolic function is normal, LVEF is estimated at 55-65%, there is no significant mitral regurgitation   Final Conclusions:   1. Continued patency of the stented segment in the LAD with moderate to severe diffuse distal LAD stenosis 2. Moderate proximal circumflex stenosis 3. Patent RCA with severe stenosis of the PDA branch in the mid to distal portion of that vessel 4. Normal LV systolic function  Recommendations: There is no clear culprit vessel for the patient's elevated troponin in the setting of hypertensive emergency. I am going to send for his cath films from [facility] last year. For now we will treat him medically. The distal LAD is not favorable for PCI because of the diffuse nature of disease and distal location. The PDA branch of the RCA is also not favorable for PCI because of its small caliber.  Tonny Bollman MD 05/23/2014, 11:19 AM

## 2014-05-23 NOTE — H&P (View-Only) (Signed)
SUBJECTIVE:  No further CP  OBJECTIVE:   Vitals:   Filed Vitals:   05/21/14 2310 05/22/14 0245 05/22/14 0616  BP: 149/89 138/73 164/78  Pulse: 79 67 65  Temp: 98.7 F (37.1 C) 98.6 F (37 C)   TempSrc: Oral Oral   Resp: 18 20   Height: 5' 11" (1.803 m)    Weight: 196 lb 1.6 oz (88.95 kg)    SpO2: 96% 98%    I&O's:  No intake or output data in the 24 hours ending 05/22/14 0742 TELEMETRY: Reviewed telemetry pt in NSR:     PHYSICAL EXAM General: Well developed, well nourished, in no acute distress Head: Eyes PERRLA, No xanthomas.   Normal cephalic and atramatic  Lungs:   Clear bilaterally to auscultation and percussion. Heart:   HRRR S1 S2 Pulses are 2+ & equal. Abdomen: Bowel sounds are positive, abdomen soft and non-tender without masses Extremities:   No clubbing, cyanosis or edema.  DP +1 Neuro: Alert and oriented X 3. Psych:  Good affect, responds appropriately   LABS: Basic Metabolic Panel:  Recent Labs  05/22/14 0347  NA 134*  K 4.5  CL 98  CO2 20  GLUCOSE 87  BUN 37*  CREATININE 5.93*  CALCIUM 8.5  MG 1.9   Liver Function Tests:  Recent Labs  05/22/14 0347  AST 19  ALT 9  ALKPHOS 86  BILITOT 0.8  PROT 6.6  ALBUMIN 3.0*   No results found for this basename: LIPASE, AMYLASE,  in the last 72 hours CBC:  Recent Labs  05/22/14 0347  WBC 5.4  NEUTROABS 3.2  HGB 10.7*  HCT 33.3*  MCV 97.1  PLT 150   Cardiac Enzymes:  Recent Labs  05/22/14 0347  TROPONINI 0.42*   BNP: No components found with this basename: POCBNP,  D-Dimer: No results found for this basename: DDIMER,  in the last 72 hours Hemoglobin A1C: No results found for this basename: HGBA1C,  in the last 72 hours Fasting Lipid Panel:  Recent Labs  05/22/14 0347  CHOL 155  HDL 37*  LDLCALC 109*  TRIG 47  CHOLHDL 4.2   Thyroid Function Tests: No results found for this basename: TSH, T4TOTAL, FREET3, T3FREE, THYROIDAB,  in the last 72 hours Anemia Panel: No  results found for this basename: VITAMINB12, FOLATE, FERRITIN, TIBC, IRON, RETICCTPCT,  in the last 72 hours Coag Panel:   No results found for this basename: INR, PROTIME    RADIOLOGY: No results found.    ASSESSMENT/PLAN: 63 y.o. male w/ PMHx significant for ESRD on MWF dialysis (?due to cyclosporine), ESLD s/p transplant in 1989, CAD s/p LAD stent 2009, who presented to [facility] with elevated BP in the 190s and chest pain --> elevated troponin.   Problem List  1. Chest pain, --> elevated troponin - unclear whether this represents a true NSTEMI from progression of CAD and unstable plaque vs. demand ischemia from hypertensive urgency in the setting of ESRD and CHF.  Initial troponin is only mildly elevated so will need to see the trend. Will make NPO after MN for cath in am to rule out progression of CAD.  Continue ASA/BB/IV Heparin gtt/statin 2. Labile BP - BP improved from admission 3. ESRD on MWF dialysis  4. S/p liver transplant  5. Known CAD s/p stent to LAD with patent stent, 50% prox circ, 20% prox and distal RCA and 99% small PDA at cath 09/2012 6. Hep C 7. Acute diastolic CHF from hypertensive urgency in   the setting of volume overload from ESRD.  He gets HD M/W/F - will consult renal to see today.  Will get an echo to assess LVF.   Quintella Reichert, MD  05/22/2014  7:42 AM

## 2014-05-23 NOTE — Consult Note (Signed)
Requesting Physician:  [facility] Reason for Consult:  Provision of dialysis and management of ESRD related issues HPI: The patient is a 63 y.o. year-old WM with a PMH significant for a liver transplant 20 years ago, ESRD (he states due to calcineurin inhibitor toxicity - on HD since around last November), HTN, and known CAD with prior stenting.  Was admitted for chest pain in the setting of severe hypertension, had NSTEMI and went to the cath lab today with findings of patent stent to the LAD, mod prox cx stenosis, patent RCA, severe stenosis of the PDA branch, no clear culprit vessel. Appears medical management is planned.  We are asked to provide his usual MWF hemodialysis. States he is compliant with HD, gets to his EDW, gains are small and says averages maybe 2 kg over a weekend. States issues with headaches and that they have been "pulling less fluid" (?)  Past Medical History  Diagnosis Date  . CAD (coronary artery disease)     Single Vessel  . CKD (chronic kidney disease)   . Chronic anemia   . Hepatitis C   . Chronic anemia   . History of syncope   . History of hyperkalemia   . Gout   . Generalized headaches     Secondary to Imdur  . History of substance abuse     Cannabis  . Myocardial infarction 2009  . Anginal pain   . Post traumatic stress disorder   . 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 Joint replacement  1989    Hip  . Broken femur with metal rod from accident      Family History: No family history on file. Social History:  reports that he quit smoking about 32 years ago. He does not have any smokeless tobacco history on file. He reports that he does not drink alcohol or use illicit drugs.  Allergies:  Allergies  Allergen Reactions  . Codeine Hives, Swelling and Rash    Home medications: Prior to Admission medications   Medication Sig Start Date End Date Taking? Authorizing Provider   acetaminophen (TYLENOL) 500 MG tablet Take 1,000 mg by mouth daily as needed (pain).   Yes Historical Provider, MD  aspirin EC 81 MG tablet Take 81 mg by mouth daily.   Yes Historical Provider, MD  busPIRone (BUSPAR) 10 MG tablet Take 5 mg by mouth 2 (two) times daily.   Yes Historical Provider, MD  calcium acetate (PHOSLO) 667 MG capsule Take 1,334 mg by mouth 3 (three) times daily with meals.   Yes Historical Provider, MD  carvedilol (COREG) 12.5 MG tablet Take 12.5 mg by mouth 2 (two) times daily with a meal.   Yes Historical Provider, MD  FLUoxetine (PROZAC) 20 MG capsule Take 40 mg by mouth at bedtime.   Yes Historical Provider, MD  mycophenolate (CELLCEPT) 250 MG capsule Take 250 mg by mouth 2 (two) times daily.   Yes Historical Provider, MD  pravastatin (PRAVACHOL) 80 MG tablet Take 80 mg by mouth at bedtime.    Yes Historical Provider, MD  sodium bicarbonate 650 MG tablet Take 1,300 mg by mouth 3 (three) times daily.   Yes Historical Provider, MD    Inpatient medications: . aspirin EC  81 mg Oral Daily  . busPIRone  5 mg Oral BID  . calcium acetate  1,334 mg Oral TID WC  . carvedilol  12.5 mg Oral BID WC  . [  START ON 05/24/2014] Chlorhexidine Gluconate Cloth  6 each Topical Q0600  . FLUoxetine  40 mg Oral QHS  . mupirocin ointment  1 application Nasal BID  . mycophenolate  250 mg Oral BID  . pantoprazole  40 mg Oral Daily  . pravastatin  80 mg Oral QHS  . sodium bicarbonate  650 mg Oral BID  . sodium chloride  3 mL Intravenous Q12H  . sodium chloride  3 mL Intravenous Q12H  . sodium chloride  3 mL Intravenous Q12H    Review of Systems Gen:  Denies headache, fever, chills, sweats.  No weight loss. HEENT:  No visual change, sore throat, difficulty swallowing. Resp:  No difficulty breathing, DOE.  No cough or hemoptysis. Cardiac:  + intermittent chest pressure for several days PTA GI:   Denies abdominal pain.   No nausea, vomiting, diarrhea.  No constipation. GU:  Denies  difficulty or change in voiding.  No change in urine color.     MS:  Denies joint pain or swelling.   Derm:  Denies skin rash or itching.  No chronic skin conditions.  Neuro:   Denies focal weakness, memory problems, hx stroke or TIA.   Psych:  Denies symptoms of depression of anxiety.  No hallucination.    Physical Exam:  Blood pressure 148/71, pulse 60, temperature 97.9 F (36.6 C), temperature source Oral, resp. rate 18, height  (1.803 m), weight 87.408 kg (192 lb 11.2 oz), SpO2 99.00%.  Gen: Very nice older WM  NAD  Had cath earlier Skin: no rash, cyanosis Neck: no JVD, no bruits or LAN Chest: Clear Heart: regular, no rub or gallop Abdomen: soft, large scar from prior liver transplant well healed Ext: No LE edema. Right groin OK. Left upper arm AVF with bruising from a prior infiltration, good bruit and thrill. Neuro: alert, Ox3, no focal deficit   Recent Labs Lab 05/22/14 0347 05/22/14 2113  NA 134* 134*  K 4.5 5.0  CL 98 98  CO2 20 20  GLUCOSE 87 146*  BUN 37* 49*  CREATININE 5.93* 6.53*  CALCIUM 8.5 8.9    Recent Labs Lab 05/22/14 0347  AST 19  ALT 9  ALKPHOS 86  BILITOT 0.8  PROT 6.6  ALBUMIN 3.0*   Recent Labs Lab 05/22/14 0347 05/22/14 2113 05/23/14 0135  WBC 5.4 5.9 5.5  NEUTROABS 3.2  --   --   HGB 10.7* 11.1* 10.6*  HCT 33.3* 32.9* 31.8*  MCV 97.1 95.9 95.8  PLT 150 166 167   Recent Labs Lab 05/22/14 0347 05/22/14 1356 05/22/14 2113  TROPONINI 0.42* 0.33* 0.33*   Dialysis prescription MWF [facility] (Dr. Fausto Skillern) 3 hours 400/600 EDW 89.5 kg 2K 2.5 Ca Heparin 2000 bolus Left AVF EPO 5000 TIW Hectorol 6 mcg TIW Venofer 50 QMonday  Impression/Plan 1. Chest pain/NSTEMI - see cath results. Presume med mgmt and BP control 2. ESRD - continue MWF HD 3. Anemia - continue ESA 4. Secondary hyperpara - hectorol with HD/binders 5. Hypertension - more aggressive med regimen.  Doubt removing more volume will help much as he seems  pretty euvolemic, even for a Monday.  I can't follow his logic of pulling less fluid due to elevated BP and headaches... 6. Liver transplant - continue cellcept   Camille Bal,  MD [facility] [phone] pager 05/23/2014, 2:37 PM

## 2014-05-23 NOTE — Care Management Note (Signed)
    Page 1 of 1   05/24/2014     11:49:45 AM CARE MANAGEMENT NOTE 05/24/2014  Patient:  [name], [name]   Account Number:  [id number]  Date Initiated:  05/23/2014  Documentation initiated by:  Jannah Guardiola  Subjective/Objective Assessment:   Pt adm on 05/21/14 with NSTEMI.  PTA, pt independent, lives with spouse.  Hx of ESRD on HD MWF.     Action/Plan:   Will follow for dc needs as pt progresses.   Anticipated DC Date:  05/24/2014   Anticipated DC Plan:  HOME/SELF CARE      DC Planning Services  CM consult      Choice offered to / List presented to:             Status of service:  Completed, signed off Medicare Important Message given?  YES (If response is "NO", the following Medicare IM given date fields will be blank) Date Medicare IM given:  05/24/2014 Medicare IM given by:  Glena Pharris Date Additional Medicare IM given:   Additional Medicare IM given by:    Discharge Disposition:  HOME/SELF CARE  Per UR Regulation:  Reviewed for med. necessity/level of care/duration of stay  If discussed at Long Length of Stay Meetings, dates discussed:    Comments:

## 2014-05-23 NOTE — Procedures (Signed)
Pt seen on HD.  Ap 110  Vp 220 with BFR 350.  His DW is listed at 89.5 but his weight now is 86.2kg.  He admits to having lost weight and BP is high.  Will pull additional 2 L tonight.

## 2014-05-23 NOTE — Progress Notes (Signed)
ANTICOAGULATION CONSULT NOTE - Follow Up Consult  Pharmacy Consult for heparin Indication: NSTEMI  Labs:  Recent Labs  05/22/14 0347 05/22/14 1350 05/22/14 1356 05/22/14 2113 05/23/14 0135  HGB 10.7*  --   --  11.1* 10.6*  HCT 33.3*  --   --  32.9* 31.8*  PLT 150  --   --  166 167  LABPROT  --   --   --  14.6  --   INR  --   --   --  1.14  --   HEPARINUNFRC  --  <0.10*  --   --  0.38  CREATININE 5.93*  --   --  6.53*  --   TROPONINI 0.42*  --  0.33* 0.33*  --     Assessment/Plan:  63yo male therapeutic on heparin after rate increase. Will continue gtt at current rate and confirm stable with additional level.   Vernard Gambles, PharmD, BCPS  05/23/2014,2:46 AM

## 2014-05-23 NOTE — Interval H&P Note (Signed)
History and Physical Interval Note:  05/23/2014 10:50 AM  Jose Redwood Sr.  has presented today for surgery, with the diagnosis of cp  The various methods of treatment have been discussed with the patient and family. After consideration of risks, benefits and other options for treatment, the patient has consented to  Procedure(s): LEFT HEART CATHETERIZATION WITH CORONARY ANGIOGRAM (N/A) as a surgical intervention .  The patient's history has been reviewed, patient examined, no change in status, stable for surgery.  I have reviewed the patient's chart and labs.  Questions were answered to the patient's satisfaction.    Cath Lab Visit (complete for each Cath Lab visit)  Clinical Evaluation Leading to the Procedure:   ACS: Yes.    Non-ACS:    Anginal Classification: CCS IV  Anti-ischemic medical therapy: Minimal Therapy (1 class of medications)  Non-Invasive Test Results: No non-invasive testing performed  Prior CABG: No previous CABG       Tonny Bollman

## 2014-05-24 LAB — RENAL FUNCTION PANEL
ALBUMIN: 3 g/dL — AB (ref 3.5–5.2)
Anion gap: 17 — ABNORMAL HIGH (ref 5–15)
BUN: 27 mg/dL — ABNORMAL HIGH (ref 6–23)
CHLORIDE: 97 meq/L (ref 96–112)
CO2: 24 mEq/L (ref 19–32)
CREATININE: 4.82 mg/dL — AB (ref 0.50–1.35)
Calcium: 8.6 mg/dL (ref 8.4–10.5)
GFR calc Af Amer: 14 mL/min — ABNORMAL LOW (ref >90)
GFR, EST NON AFRICAN AMERICAN: 12 mL/min — AB (ref >90)
Glucose, Bld: 77 mg/dL (ref 70–99)
POTASSIUM: 4.2 meq/L (ref 3.7–5.3)
Phosphorus: 3.5 mg/dL (ref 2.3–4.6)
Sodium: 138 mEq/L (ref 137–147)

## 2014-05-24 LAB — CBC
HCT: 34 % — ABNORMAL LOW (ref 39.0–52.0)
Hemoglobin: 11.5 g/dL — ABNORMAL LOW (ref 13.0–17.0)
MCH: 32.1 pg (ref 26.0–34.0)
MCHC: 33.8 g/dL (ref 30.0–36.0)
MCV: 95 fL (ref 78.0–100.0)
Platelets: 187 10*3/uL (ref 150–400)
RBC: 3.58 MIL/uL — ABNORMAL LOW (ref 4.22–5.81)
RDW: 13.7 % (ref 11.5–15.5)
WBC: 5.1 10*3/uL (ref 4.0–10.5)

## 2014-05-24 LAB — HEPATITIS B SURFACE ANTIGEN: HEP B S AG: NEGATIVE

## 2014-05-24 LAB — POCT ACTIVATED CLOTTING TIME: Activated Clotting Time: 112 seconds

## 2014-05-24 LAB — HEPATITIS B SURFACE ANTIBODY,QUALITATIVE: Hep B S Ab: POSITIVE — AB

## 2014-05-24 LAB — HEPATITIS B CORE ANTIBODY, TOTAL: Hep B Core Total Ab: NONREACTIVE

## 2014-05-24 LAB — GLUCOSE, CAPILLARY: Glucose-Capillary: 88 mg/dL (ref 70–99)

## 2014-05-24 MED ORDER — NITROGLYCERIN 0.4 MG SL SUBL: 0.4000 mg | SUBLINGUAL_TABLET | SUBLINGUAL | Status: AC | PRN

## 2014-05-24 MED ORDER — CARVEDILOL 25 MG PO TABS: 25.0000 mg | ORAL_TABLET | Freq: Two times a day (BID) | ORAL | Status: DC

## 2014-05-24 NOTE — Progress Notes (Signed)
Patient Profile: 63 y.o. male w/ PMHx significant for ESRD on MWF dialysis (?due to cyclosporine), ESLD s/p transplant in 1989, CAD s/p LAD stent 2009, who presented to [facility] with elevated BP in the 190s and chest pain. Elevated troponin at 0.73---> transferred to [facility].    Subjective: Chest pain free. Denies further dyspnea. No right groin, flank or low back pain. Ambulating w/o difficulty.   Objective: Vital signs in last 24 hours: Temp:  [97.9 F (36.6 C)-98.9 F (37.2 C)] 98.9 F (37.2 C) (09/15 0643) Pulse Rate:  [52-71] 69 (09/15 0643) Resp:  [18-21] 18 (09/15 0643) BP: (148-188)/(64-99) 161/78 mmHg (09/15 0643) SpO2:  [95 %-100 %] 96 % (09/15 0643) Weight:  [186 lb 8.2 oz (84.6 kg)-190 lb 0.6 oz (86.2 kg)] 188 lb 15 oz (85.7 kg) (09/15 0643) Last BM Date: 05/21/14  Intake/Output from previous day: 09/14 0701 - 09/15 0700 In: -  Out: 1646  Intake/Output this shift: Total I/O In: -  Out: 200 [Urine:200]  Medications Current Facility-Administered Medications  Medication Dose Route Frequency Provider Last Rate Last Dose  . 0.9 %  sodium chloride infusion  250 mL Intravenous PRN Ardis Rowan, MD      . 0.9 %  sodium chloride infusion  250 mL Intravenous PRN Quintella Reichert, MD      . 0.9 %  sodium chloride infusion   Intravenous Continuous Quintella Reichert, MD 10 mL/hr at 05/23/14 0600 10 mL/hr at 05/23/14 0600  . 0.9 %  sodium chloride infusion  250 mL Intravenous PRN Tonny Bollman, MD      . acetaminophen (TYLENOL) tablet 650 mg  650 mg Oral Q6H PRN Ardis Rowan, MD   650 mg at 05/21/14 2339  . aspirin EC tablet 81 mg  81 mg Oral Daily Ardis Rowan, MD      . busPIRone (BUSPAR) tablet 5 mg  5 mg Oral BID Ardis Rowan, MD   5 mg at 05/23/14 2256  . calcium acetate (PHOSLO) capsule 1,334 mg  1,334 mg Oral TID WC Ardis Rowan, MD   1,334 mg at 05/24/14 0750  . carvedilol (COREG) tablet 12.5 mg  12.5 mg Oral BID WC Ardis Rowan, MD   12.5 mg  at 05/24/14 0750  . Chlorhexidine Gluconate Cloth 2 % PADS 6 each  6 each Topical Q0600 Thurmon Fair, MD   6 each at 05/24/14 0655  . [START ON 05/25/2014] doxercalciferol (HECTOROL) injection 6 mcg  6 mcg Intravenous Q M,W,F-HD Sadie Haber, MD      . ferric gluconate (NULECIT) 125 mg in sodium chloride 0.9 % 100 mL IVPB  125 mg Intravenous Q Mon-HD Sadie Haber, MD   125 mg at 05/23/14 2122  . FLUoxetine (PROZAC) capsule 40 mg  40 mg Oral QHS Ardis Rowan, MD   40 mg at 05/23/14 2256  . mupirocin ointment (BACTROBAN) 2 % 1 application  1 application Nasal BID Thurmon Fair, MD   1 application at 05/23/14 2257  . mycophenolate (CELLCEPT) capsule 250 mg  250 mg Oral BID Ardis Rowan, MD   250 mg at 05/23/14 2256  . nitroGLYCERIN (NITROSTAT) SL tablet 0.4 mg  0.4 mg Sublingual Q5 Min x 3 PRN Ardis Rowan, MD      . ondansetron [facility]) injection 4 mg  4 mg Intravenous Q6H PRN Tonny Bollman, MD      . pantoprazole (PROTONIX) EC tablet 40 mg  40 mg Oral Daily Ardis Rowan, MD   40 mg at  05/23/14 1530  . pravastatin (PRAVACHOL) tablet 80 mg  80 mg Oral QHS Katriel Cutsforth, MD   80 mg at 05/23/14 2256  . sodium bicarbonate tablet 650 mg  650 mg Oral BID Ardis Rowan, MD   650 mg at 05/23/14 2305  . sodium chloride 0.9 % injection 3 mL  3 mL Intravenous Q12H Ardis Rowan, MD   3 mL at 05/22/14 0200  . sodium chloride 0.9 % injection 3 mL  3 mL Intravenous PRN Ardis Rowan, MD      . sodium chloride 0.9 % injection 3 mL  3 mL Intravenous Q12H Quintella Reichert, MD   3 mL at 05/23/14 2306  . sodium chloride 0.9 % injection 3 mL  3 mL Intravenous PRN Quintella Reichert, MD      . sodium chloride 0.9 % injection 3 mL  3 mL Intravenous Q12H Tonny Bollman, MD      . sodium chloride 0.9 % injection 3 mL  3 mL Intravenous PRN Tonny Bollman, MD        PE: General appearance: alert, cooperative and no distress Neck: no carotid bruit and no JVD Lungs: clear to auscultation  bilaterally Heart: regular rate and rhythm and 2/6 SM best heard along RUSB and apex Extremities: no LEE; right groin w/o hematoma and bruit Pulses: 2+ and symmetric Skin: warm and dry Neurologic: Grossly normal  Lab Results:   Recent Labs  05/22/14 2113 05/23/14 0135 05/24/14 0455  WBC 5.9 5.5 5.1  HGB 11.1* 10.6* 11.5*  HCT 32.9* 31.8* 34.0*  PLT 166 167 187   BMET  Recent Labs  05/22/14 2113 05/23/14 1919 05/24/14 0455  NA 134* 132* 138  K 5.0 4.9 4.2  CL 98 98 97  CO2 GLUCOSE 146* 109* 77  BUN 49* 59* 27*  CREATININE 6.53* 7.58* 4.82*  CALCIUM 8.9 8.7 8.6   PT/INR  Recent Labs  05/22/14 2113  LABPROT 14.6  INR 1.14   Cholesterol  Recent Labs  05/22/14 0347  CHOL 155   Cardiac Panel (last 3 results)  Recent Labs  05/22/14 0347 05/22/14 1356 05/22/14 2113  TROPONINI 0.42* 0.33* 0.33*    Studies/Results:  LHC 05/23/14 Final Conclusions:  1. Continued patency of the stented segment in the LAD with moderate to severe diffuse distal LAD stenosis  2. Moderate proximal circumflex stenosis  3. Patent RCA with severe stenosis of the PDA branch in the mid to distal portion of that vessel  4. Normal LV systolic function  Assessment/Plan  Active Problems:   NSTEMI (non-ST elevated myocardial infarction)   Hypertensive urgency   Dyslipidemia   End stage renal disease   Demand ischemia   Acute diastolic CHF (congestive heart failure)  1. Elevated troponin: No further chest pain. details of LHC outlined above. Previously placed LAD stent remains patient. No clear culprit vessel for the patient's elevated troponin in the setting of hypertensive emergency. The distal LAD is not favorable for PCI because of the diffuse nature of disease and distal location. The PDA branch of the RCA is also not favorable for PCI because of its small caliber. LVF is normal. Continue medical therapy: ASA, BB and statin. No ACE given CKD.   2. Hypertensive  Urgency: BP still elevated this am in the 160s systolic. HR currently in the 70s. Can further increase his coreg to 25 mg BID.   3. Dyslipidemia: LDL elevated at 109. Goal is <70. Continue statin therapy. ? Adding Zeiia.  4. Acute Diastolic CHF: resolved. Denies further dyspnea.  5. ESRD:  Continue HD per Nephrology.   6. Post Cath: no complications. Right groin free of hematoma and bruit. Ambulating w/o difficulty. HD was completed yesterday post cath.    Dispo: likely discharge home today.    LOS: 3 days    Brittainy M. Sharol Harness, PA-C 05/24/2014 8:04 AM  I have seen and examined the patient along with Brittainy M. Sharol Harness, PA-C.  I have reviewed the chart, notes and new data.  I agree with PA's note.  Key new complaints: no angina Key examination changes: healthy groin access site, BP still a little high Key new findings / data: favorable findings at cath - likely small area of injury due to demand ischemia in distal LAD or PDA territory in the setting of severely elevated BP. Neither coronary territory is amenable to revascularization.  PLAN: Increase carvedilol. F/U in [place] office.  Thurmon Fair, MD, [facility] [phone] 05/24/2014, 9:00 AM

## 2014-05-24 NOTE — Discharge Summary (Signed)
Physician Discharge Summary  Patient ID: Jose Hassebrock Sr. MRN: [id number] DOB/AGE: October 13, 1950 63 y.o.  Admit date: 05/21/2014 Discharge date: 05/24/2014  Admission Diagnoses: NSTEMI   Discharge Diagnoses:  Active Problems:   NSTEMI (non-ST elevated myocardial infarction)   Hypertensive urgency   Dyslipidemia   End stage renal disease   Demand ischemia   Acute diastolic CHF (congestive heart failure)   Discharged Condition: stable  Hospital Course: The patient is a 63 y/o male, previously followed by Dr. Natasha Bence. He receives his primary care at the [facility]. He has a PMHx significant for ESRD on MWF dialysis, ESLD s/p transplant in 1989, CAD s/p LAD stent 2009.  He initially presented to [facility] on 05/21/13 with chest pain and dyspnea. On arrival, he was noted to be hypertensive with systolic BPs in the 190s. He reported that over the last several weeks, he has had difficulty with increased BP and headaches. He had previously been on multiple antihypertensives, but states that several were discharged due to his BP being well controlled. His EKG at [facility] showed no acute changes. However, troponin was elevated at 0.73. BNP was also elevated at 2155. Subsequently, he was transferred to [facility] for further care. He was placed on IV heparin and IV Nitro. His pain resolved and his BP improved. He was also continued on his ASA and statin. Coreg was also added for additional BP control. Nephrology was consulted and inpatient HD was arranged. Cardiac enzymes were cycled at [facility] and were also elevated x 3 with max elevation at 0.42. Subsequently, he underwent a diagnostic LHC. The procedure was performed by Dr. Excell Seltzer. Access was obtained via the right femoral artery.  He was found to have continued patency of the previously placed LAD stent with moderate to severe diffuse distal LAD disease as well as moderate proximal circumflex stenosis. His RCA stent was also patent and there was severe  stenosis of the PDA branch in the mid to distal portion of that vessel. The distal LAD was felt not favorable for PCI because of the diffuse nature of disease and distal location. The PDA branch of the RCA was also not favorable for PCI because of its small caliber. Continued medical therapy was recommended. LVF was also normal at 55-60%. He left the cath lab in stable condition and had no post cath complications. He denied further chest pain and his right femoral access site remained stable. Prior to discharge, he did require further increase of his Coreg to 25 mg BID for additional BP control. He was last seen and examined by Dr. Royann Shivers, who determined he was stable for discharge home. He is scheduled for post hospital follow-up in [facility] with Dr. Purvis Sheffield on 06/07/14/     Consults: nephrology  Significant Diagnostic Studies:   [facility] 05/23/14 Coronary angiography:  Coronary dominance: right  Left mainstem: The left main is patent. There is nonobstructive disease present. The distal left main has 20-30% stenosis before it divides into the LAD and left circumflex.  Left anterior descending (LAD): The LAD is patent. There are proximal irregularities noted. The stented segment in the mid LAD is widely patent. The first diagonal has severe diffuse disease with 80-90% stenosis extending into the distal diagonal. This is not treatable by PCI. The mid LAD beyond the stented segment also has severe diffuse disease extending into the apical portion of the LAD. This is estimated at 80% stenosis.  Left circumflex (LCx): The left circumflex is a large-caliber vessel. The proximal vessel  has 50-60% stenosis with mild hypodensity noted. The distal vessel an OM branches are patent with no significant stenosis. The distal OM divides into 2 medium caliber subbranches.  Right coronary artery (RCA): The RCA is a large, dominant vessel. The vessel takes a 360 turn at the junction of the mid and distal RCA. The mid RCA,  proximal to the area of tortuosity, has a warty percent stenosis. The PLA branch is patent. The PDA branch has 95% stenosis in its midportion and this has been described on his previous studies.  Left ventriculography: Left ventricular systolic function is normal, LVEF is estimated at 55-65%, there is no significant mitral regurgitation    Treatments: See Hospital Course  Discharge Exam: Blood pressure 161/78, pulse 69, temperature 98.9 F (37.2 C), temperature source Oral, resp. rate 18, height  (1.803 m), weight 188 lb 15 oz (85.7 kg), SpO2 96.00%.   Disposition: 01-Home or Self Care      Discharge Instructions   Diet - low sodium heart healthy    Complete by:  As directed      Increase activity slowly    Complete by:  As directed             Medication List    STOP taking these medications       amLODipine-benazepril 10-20 MG per capsule  Commonly known as:  LOTREL      TAKE these medications       acetaminophen 500 MG tablet  Commonly known as:  TYLENOL  Take 1,000 mg by mouth daily as needed (pain).     aspirin EC 81 MG tablet  Take 81 mg by mouth daily.     busPIRone 10 MG tablet  Commonly known as:  BUSPAR  Take 5 mg by mouth 2 (two) times daily.     calcium acetate 667 MG capsule  Commonly known as:  PHOSLO  Take 1,334 mg by mouth 3 (three) times daily with meals.     carvedilol 25 MG tablet  Commonly known as:  COREG  Take 1 tablet (25 mg total) by mouth 2 (two) times daily with a meal.     FLUoxetine 20 MG capsule  Commonly known as:  PROZAC  Take 40 mg by mouth at bedtime.     mycophenolate 250 MG capsule  Commonly known as:  CELLCEPT  Take 250 mg by mouth 2 (two) times daily.     nitroGLYCERIN 0.4 MG SL tablet  Commonly known as:  NITROSTAT  Place 1 tablet (0.4 mg total) under the tongue every 5 (five) minutes x 3 doses as needed for chest pain.     pravastatin 80 MG tablet  Commonly known as:  PRAVACHOL  Take 80 mg by mouth at  bedtime.     sodium bicarbonate 650 MG tablet  Take 1,300 mg by mouth 3 (three) times daily.       Follow-up Information   Follow up with Laqueta Linden, MD On 06/07/2014. (9:20 (Cardiology Follow-up))    Specialty:  Cardiology   Contact information:   [address] [place] [address] [phone]      TIME SPENT ON DISCHARGE, INCLUDING PHYSICIAN TIME: >30 MINUTES   Signed: Robbie Lis 05/24/2014, 12:41 PM

## 2014-06-07 ENCOUNTER — Encounter: Payer: Self-pay | Admitting: Cardiovascular Disease

## 2014-06-07 ENCOUNTER — Ambulatory Visit (INDEPENDENT_AMBULATORY_CARE_PROVIDER_SITE_OTHER): Payer: Commercial Managed Care - HMO | Admitting: Cardiovascular Disease

## 2014-06-07 VITALS — BP 152/77 | HR 69 | Ht 71.0 in | Wt 192.0 lb

## 2014-06-07 DIAGNOSIS — I209 Angina pectoris, unspecified: Secondary | ICD-10-CM

## 2014-06-07 DIAGNOSIS — Z87898 Personal history of other specified conditions: Secondary | ICD-10-CM

## 2014-06-07 DIAGNOSIS — I151 Hypertension secondary to other renal disorders: Secondary | ICD-10-CM

## 2014-06-07 DIAGNOSIS — I150 Renovascular hypertension: Secondary | ICD-10-CM

## 2014-06-07 DIAGNOSIS — E785 Hyperlipidemia, unspecified: Secondary | ICD-10-CM

## 2014-06-07 DIAGNOSIS — N2889 Other specified disorders of kidney and ureter: Secondary | ICD-10-CM

## 2014-06-07 DIAGNOSIS — Z944 Liver transplant status: Secondary | ICD-10-CM

## 2014-06-07 DIAGNOSIS — N186 End stage renal disease: Secondary | ICD-10-CM

## 2014-06-07 DIAGNOSIS — I25119 Atherosclerotic heart disease of native coronary artery with unspecified angina pectoris: Secondary | ICD-10-CM

## 2014-06-07 DIAGNOSIS — I5032 Chronic diastolic (congestive) heart failure: Secondary | ICD-10-CM

## 2014-06-07 DIAGNOSIS — I2510 Atherosclerotic heart disease of native coronary artery without angina pectoris: Secondary | ICD-10-CM

## 2014-06-07 DIAGNOSIS — Z9289 Personal history of other medical treatment: Secondary | ICD-10-CM

## 2014-06-07 MED ORDER — ISOSORBIDE MONONITRATE ER 30 MG PO TB24: 30.0000 mg | ORAL_TABLET | Freq: Every day | ORAL | Status: DC

## 2014-06-07 MED ORDER — AMLODIPINE BESYLATE 5 MG PO TABS: 5.0000 mg | ORAL_TABLET | Freq: Every day | ORAL | Status: AC

## 2014-06-07 MED ORDER — AMLODIPINE BESYLATE 5 MG PO TABS: 5.0000 mg | ORAL_TABLET | Freq: Every day | ORAL | Status: DC

## 2014-06-07 MED ORDER — CARVEDILOL 25 MG PO TABS: 25.0000 mg | ORAL_TABLET | Freq: Two times a day (BID) | ORAL | Status: DC

## 2014-06-07 MED ORDER — CARVEDILOL 25 MG PO TABS: 25.0000 mg | ORAL_TABLET | Freq: Two times a day (BID) | ORAL | Status: AC

## 2014-06-07 NOTE — Progress Notes (Signed)
Patient ID: [name]., male   DOB: 07/11/1951, 63 y.o.   MRN: [id number]      SUBJECTIVE: The patient is a 63 year old male who I am evaluating for the first time. He was recently hospitalized for a non-STEMI, hypertensive urgency, and acute diastolic heart failure. He also has a history of dyslipidemia and end-stage renal disease. He receives his primary care at the [facility]. He receives dialysis on M,W, and F, and also has end stage liver disease s/p transplant [facility] in 11/[facility], CAD s/p multiple PCI's. He underwent coronary angiography and was found to have continued patency of the previously placed LAD stent with moderate to severe diffuse distal LAD disease as well as moderate proximal circumflex stenosis. His RCA stent was also patent and there was severe stenosis of the PDA branch in the mid to distal portion of that vessel. The distal LAD was felt not favorable for PCI because of the diffuse nature of disease and distal location. The PDA branch of the RCA was also not favorable for PCI because of its small caliber. Continued medical therapy was recommended. LV function was normal at 55-60%. He has felt weak since discharge and is gradually regaining his strength. He denies chest pain. He has been having exertional dyspnea which he says is chronic. He occasionally has palpitations but denies leg swelling and syncope. He had been on amlodipine and an ACE inhibitor in the past. He said his [facility] doctor increased his Coreg to 75 mg daily and he has felt weak since then.    Review of Systems: As per "subjective", otherwise negative.  Allergies  Allergen Reactions  . Codeine Hives, Swelling and Rash    Current Outpatient Prescriptions  Medication Sig Dispense Refill  . acetaminophen (TYLENOL) 500 MG tablet Take 1,000 mg by mouth daily as needed (pain).      Marland Kitchen. aspirin EC 81 MG tablet Take 81 mg by mouth daily.      . busPIRone (BUSPAR) 10 MG tablet Take 5 mg by mouth 2 (two) times  daily.      . calcium acetate (PHOSLO) 667 MG capsule Take 667 mg by mouth 3 (three) times daily with meals.       . carvedilol (COREG) 25 MG tablet Take 1 tablet (25 mg total) by mouth 2 (two) times daily with a meal.  60 tablet  5  . FLUoxetine (PROZAC) 20 MG capsule Take 40 mg by mouth at bedtime.      . mycophenolate (CELLCEPT) 250 MG capsule Take 750 mg by mouth 2 (two) times daily.       . nitroGLYCERIN (NITROSTAT) 0.4 MG SL tablet Place 1 tablet (0.4 mg total) under the tongue every 5 (five) minutes x 3 doses as needed for chest pain.  25 tablet  2  . pravastatin (PRAVACHOL) 80 MG tablet Take 80 mg by mouth at bedtime.       . sodium bicarbonate 650 MG tablet Take 1,300 mg by mouth 3 (three) times daily.       No current facility-administered medications for this visit.    Past Medical History  Diagnosis Date  . CAD (coronary artery disease)     Single Vessel  . CKD (chronic kidney disease)   . Chronic anemia   . Hepatitis C   . Chronic anemia   . History of syncope   . History of hyperkalemia   . Gout   . Generalized headaches     Secondary to Imdur  .  History of substance abuse     Cannabis  . Myocardial infarction 2009  . Anginal pain   . Post traumatic stress disorder   . 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 Joint replacement  1989    Hip  . Broken femur with metal rod from accident      History   Social History  . Marital Status: Married    Spouse Name: N/A    Number of Children: N/A  . Years of Education: N/A   Occupational History  . Not on file.   Social History Main Topics  . Smoking status: Former Smoker -- 0.50 packs/day for 10 years    Types: Cigarettes    Start date: 04/12/1971    Quit date: 09/09/1981  . Smokeless tobacco: Former Neurosurgeon    Types: Chew    Quit date: 09/09/1981     Comment: smoked cigarettes and chewed tobacco while in service  . Alcohol Use: No  . Drug Use: No  .  Sexual Activity: Not on file   Other Topics Concern  . Not on file   Social History Narrative  . No narrative on file     Filed Vitals:   06/07/14 0929  BP: 152/77  Pulse: 69  Height: 5\' 11"  (1.803 m)  Weight: 192 lb (87.091 kg)    PHYSICAL EXAM General: NAD HEENT: Normal. Neck: No JVD, no thyromegaly. Lungs: Clear to auscultation bilaterally with normal respiratory effort. CV: Nondisplaced PMI.  Regular rate and rhythm, normal S1/S2, no S3/S4, no murmur. No pretibial or periankle edema.  No carotid bruit.  Normal pedal pulses.  Abdomen: Soft, nontender, no hepatosplenomegaly, no distention.  Neurologic: Alert and oriented x 3.  Psych: Normal affect. Skin: Normal. Musculoskeletal: Normal range of motion, no gross deformities. Extremities: No clubbing or cyanosis.   ECG: Most recent ECG reviewed.      ASSESSMENT AND PLAN: 1. CAD: Continue ASA and reduce Coreg to 25 mg bid. Will start Imdur 30 mg daily, and continue pravastatin 80 mg. Obtain echo performed at the [place] this past spring. 2. Chronic diastolic heart failure: Euvolemic. Fluid is managed by dialysis. 3. Essential HTN: Elevated today. Will restart amlodipine 5 mg daily as I am concomitantly reducing Coreg. 4. Hyperlipidemia: On high intensity statin therapy. 5. ESLD s/p transplant: Stable. 6. ESRD on dialysis: Being worked up for kidney transplant, with plans for stress testing in 07/2014.  Dispo: f/u 2-3 months.  Prentice Docker, M.D., F.A.C.C.

## 2014-06-07 NOTE — Addendum Note (Signed)
Addended by: Lesle ChrisHILL, Keeton Kassebaum G on: 06/07/2014 03:12 PM   Modules accepted: Orders

## 2014-06-07 NOTE — Patient Instructions (Signed)
   Decrease Coreg to 25mg  twice a day (already has)  Resume Amlodipine 5mg  daily (already has)  Begin Imdur 30mg  daily (new sent to local pharm)  90 day printed script given today for medications above (gets medications from [facility]) Continue all other medications.   Follow up in  2-3 months

## 2014-06-10 ENCOUNTER — Telehealth: Payer: Self-pay | Admitting: *Deleted

## 2014-06-10 NOTE — Telephone Encounter (Signed)
Message copied by Lesle ChrisHILL, Zayven Powe G on Fri Jun 10, 2014  2:01 PM ------      Message from: Prentice DockerKONESWARAN, SURESH A      Created: Tue Jun 07, 2014  4:34 PM       Normal pumping function. ------

## 2014-06-10 NOTE — Telephone Encounter (Signed)
Notes Recorded by Lesle ChrisAngela G Hill, LPN on 56/4/332910/10/2013 at 2:00 PM Patient notified.

## 2014-08-11 ENCOUNTER — Encounter: Payer: Self-pay | Admitting: Cardiovascular Disease

## 2014-08-11 ENCOUNTER — Ambulatory Visit (INDEPENDENT_AMBULATORY_CARE_PROVIDER_SITE_OTHER): Payer: Commercial Managed Care - HMO | Admitting: Cardiovascular Disease

## 2014-08-11 VITALS — BP 116/67 | HR 74 | Ht 71.5 in | Wt 195.0 lb

## 2014-08-11 DIAGNOSIS — I25119 Atherosclerotic heart disease of native coronary artery with unspecified angina pectoris: Secondary | ICD-10-CM

## 2014-08-11 DIAGNOSIS — I5032 Chronic diastolic (congestive) heart failure: Secondary | ICD-10-CM

## 2014-08-11 DIAGNOSIS — I151 Hypertension secondary to other renal disorders: Secondary | ICD-10-CM

## 2014-08-11 DIAGNOSIS — N2889 Other specified disorders of kidney and ureter: Secondary | ICD-10-CM

## 2014-08-11 DIAGNOSIS — Z944 Liver transplant status: Secondary | ICD-10-CM

## 2014-08-11 DIAGNOSIS — N186 End stage renal disease: Secondary | ICD-10-CM

## 2014-08-11 DIAGNOSIS — E785 Hyperlipidemia, unspecified: Secondary | ICD-10-CM

## 2014-08-11 MED ORDER — RANOLAZINE ER 500 MG PO TB12: 500.0000 mg | ORAL_TABLET | Freq: Two times a day (BID) | ORAL | Status: AC

## 2014-08-11 NOTE — Patient Instructions (Signed)
Your physician recommends that you schedule a follow-up appointment in: 4 months. You will receive a reminder letter in the mail in about 2 months reminding you to call and schedule your appointment. If you don't receive this letter, please contact our office. Your physician has recommended you make the following change in your medication:  Stop isosorbide mononitrate. Start ranexa 500 mg twice daily. Continue all other medications the same.

## 2014-08-11 NOTE — Progress Notes (Signed)
Patient ID: Jose RedwoodLarry Michael Detert Sr., male   DOB: 09/12/1950, 63 y.o.   MRN: [id number]      SUBJECTIVE: The patient returns for routine cardiovascular follow-up. He denies chest pain. He may only get short of breath when he significantly exerts himself with activities like yardwork. He gets lightheaded and dizzy and this has been chronic, but he denies syncope. He is due to see a cardiologist at [facility] on December 10. He recently underwent a nuclear stress test on November 10 which was abnormal and stated "limited myocardial flow reserve and therefore the potential for myocardial ischemia in the posterior descending coronary artery distribution. Diaphragmatic attenuation artifact may also contribute to the resting portion of the defect. Left ventricular enlargement and hypertrophy with approximately normal systolic function." Imdur has been causing headaches.  In summary, he was hospitalized in 05/2014 for a non-STEMI, hypertensive urgency, and acute diastolic heart failure. He also has a history of dyslipidemia and end-stage renal disease. [facility]. He receives dialysis on M,W, and F, and also has end stage liver disease s/p [facility] in [facility], CAD s/p multiple PCI's. He underwent coronary angiography and was found to have continued patency of the previously placed LAD stent with moderate to severe diffuse distal LAD disease as well as moderate proximal circumflex stenosis. His RCA stent was also patent and there was severe stenosis of the PDA branch in the mid to distal portion of that vessel. The distal LAD was felt not favorable for PCI because of the diffuse nature of disease and distal location. The PDA branch of the RCA was also not favorable for PCI because of its small caliber. Continued medical therapy was recommended. LV function was normal at 55-60%.  Review of Systems: As per "subjective", otherwise negative.  Allergies  Allergen  Reactions  . Codeine Hives, Swelling and Rash    Current Outpatient Prescriptions  Medication Sig Dispense Refill  . acetaminophen (TYLENOL) 500 MG tablet Take 1,000 mg by mouth daily as needed (pain).    Marland Kitchen. amLODipine (NORVASC) 5 MG tablet Take 1 tablet (5 mg total) by mouth daily. 90 tablet 3  . aspirin EC 81 MG tablet Take 81 mg by mouth daily.    . busPIRone (BUSPAR) 10 MG tablet Take 5 mg by mouth 2 (two) times daily.    . calcium acetate (PHOSLO) 667 MG capsule Take 667 mg by mouth 3 (three) times daily with meals.     . carvedilol (COREG) 25 MG tablet Take 1 tablet (25 mg total) by mouth 2 (two) times daily. 180 tablet 3  . FLUoxetine (PROZAC) 20 MG capsule Take 40 mg by mouth at bedtime.    . isosorbide mononitrate (IMDUR) 30 MG 24 hr tablet Take 1 tablet (30 mg total) by mouth daily. 90 tablet 3  . mycophenolate (CELLCEPT) 250 MG capsule Take 750 mg by mouth 2 (two) times daily.     . nitroGLYCERIN (NITROSTAT) 0.4 MG SL tablet Place 1 tablet (0.4 mg total) under the tongue every 5 (five) minutes x 3 doses as needed for chest pain. 25 tablet 2  . pravastatin (PRAVACHOL) 80 MG tablet Take 80 mg by mouth at bedtime.     . sodium bicarbonate 650 MG tablet Take 1,300 mg by mouth 3 (three) times daily.     No current facility-administered medications for this visit.    Past Medical History  Diagnosis Date  . CAD (coronary artery disease)  Single Vessel  . CKD (chronic kidney disease)   . Chronic anemia   . Hepatitis C   . Chronic anemia   . History of syncope   . History of hyperkalemia   . Gout   . Generalized headaches     Secondary to Imdur  . History of substance abuse     Cannabis  . Myocardial infarction 2009  . Anginal pain   . Post traumatic stress disorder   . 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 Joint replacement  1989    Hip  . Broken femur with metal rod from accident      History   Social  History  . Marital Status: Married    Spouse Name: N/A    Number of Children: N/A  . Years of Education: N/A   Occupational History  . Not on file.   Social History Main Topics  . Smoking status: Former Smoker -- 0.50 packs/day for 10 years    Types: Cigarettes    Start date: 04/12/1971    Quit date: 09/09/1981  . Smokeless tobacco: Former NeurosurgeonUser    Types: Chew    Quit date: 09/09/1981     Comment: smoked cigarettes and chewed tobacco while in service  . Alcohol Use: No  . Drug Use: No  . Sexual Activity: Not on file   Other Topics Concern  . Not on file   Social History Narrative     Filed Vitals:   08/11/14 0820  BP: 116/67  Pulse: 74  Height: 5' 11.5" (1.816 m)  Weight: 195 lb (88.451 kg)  SpO2: 98%    PHYSICAL EXAM General: NAD HEENT: Normal. Neck: No JVD, no thyromegaly. Lungs: Clear to auscultation bilaterally with normal respiratory effort. CV: Nondisplaced PMI.  Regular rate and rhythm, normal S1/S2, no S3/S4, no murmur. No pretibial or periankle edema.  No carotid bruit.  Normal pedal pulses.  Abdomen: Soft, nontender, no hepatosplenomegaly, no distention.  Neurologic: Alert and oriented x 3.  Psych: Normal affect. Skin: Normal. Musculoskeletal: Normal range of motion, no gross deformities. Extremities: No clubbing or cyanosis.   ECG: Most recent ECG reviewed.      ASSESSMENT AND PLAN: 1. CAD: Stable ischemic heart disease with stress test results noted above. Continue ASA and Coreg 25 mg bid. Will discontinue Imdur 30 mg due to headaches, and start Ranexa 500 mg bid. Continue pravastatin 80 mg. [facility] showed normal LV systolic function, EF > 55%, with grade 1 diastolic dysfunction. 2. Chronic diastolic heart failure: Euvolemic. Fluid is managed by dialysis. 3. Essential HTN: Normal. Continue Coreg and amlodipine. 4. Hyperlipidemia: On high intensity statin therapy. 5. ESLD s/p transplant: Stable. 6. ESRD on dialysis:  Being worked up for kidney transplant.  Dispo: f/u 4 months.   Prentice DockerSuresh Koneswaran, M.D., F.A.C.C.

## 2014-08-18 ENCOUNTER — Encounter (HOSPITAL_COMMUNITY): Payer: Self-pay | Admitting: Cardiovascular Disease

## 2014-12-20 ENCOUNTER — Ambulatory Visit: Payer: Commercial Managed Care - HMO | Admitting: Cardiovascular Disease

## 2014-12-21 ENCOUNTER — Encounter: Payer: Self-pay | Admitting: Cardiovascular Disease

## 2015-02-08 DEATH — deceased

## 2022-09-09 DEATH — deceased
